# Patient Record
Sex: Female | Born: 1940 | Race: White | Hispanic: No | Marital: Married | Smoking: Never smoker
Health system: Southern US, Community
[De-identification: ages and names within clinical notes are randomized; demographics above are authoritative.]

## PROBLEM LIST (undated history)

## (undated) DIAGNOSIS — E785 Hyperlipidemia, unspecified: Secondary | ICD-10-CM

## (undated) DIAGNOSIS — E119 Type 2 diabetes mellitus without complications: Secondary | ICD-10-CM

## (undated) HISTORY — PX: BREAST REDUCTION SURGERY: SHX8

## (undated) HISTORY — PX: SHOULDER SURGERY: SHX246

---

## 2019-08-15 ENCOUNTER — Encounter (HOSPITAL_COMMUNITY): Payer: Self-pay | Admitting: Emergency Medicine

## 2019-08-15 ENCOUNTER — Other Ambulatory Visit: Payer: Self-pay

## 2019-08-15 ENCOUNTER — Inpatient Hospital Stay (HOSPITAL_COMMUNITY)
Admission: EM | Admit: 2019-08-15 | Discharge: 2019-08-19 | DRG: 392 | Disposition: A | Discharge status: Home / Self Care | Payer: Medicare Other | Attending: Internal Medicine | Admitting: Internal Medicine

## 2019-08-15 DIAGNOSIS — Z20828 Contact with and (suspected) exposure to other viral communicable diseases: Secondary | ICD-10-CM | POA: Diagnosis present

## 2019-08-15 DIAGNOSIS — E119 Type 2 diabetes mellitus without complications: Secondary | ICD-10-CM

## 2019-08-15 DIAGNOSIS — E785 Hyperlipidemia, unspecified: Secondary | ICD-10-CM | POA: Diagnosis present

## 2019-08-15 DIAGNOSIS — E860 Dehydration: Secondary | ICD-10-CM | POA: Diagnosis present

## 2019-08-15 DIAGNOSIS — D649 Anemia, unspecified: Secondary | ICD-10-CM

## 2019-08-15 DIAGNOSIS — K529 Noninfective gastroenteritis and colitis, unspecified: Secondary | ICD-10-CM | POA: Diagnosis not present

## 2019-08-15 DIAGNOSIS — N390 Urinary tract infection, site not specified: Secondary | ICD-10-CM

## 2019-08-15 DIAGNOSIS — R109 Unspecified abdominal pain: Secondary | ICD-10-CM | POA: Diagnosis present

## 2019-08-15 DIAGNOSIS — N10 Acute tubulo-interstitial nephritis: Secondary | ICD-10-CM | POA: Clinically undetermined

## 2019-08-15 DIAGNOSIS — D539 Nutritional anemia, unspecified: Secondary | ICD-10-CM | POA: Diagnosis present

## 2019-08-15 DIAGNOSIS — E1165 Type 2 diabetes mellitus with hyperglycemia: Secondary | ICD-10-CM | POA: Diagnosis present

## 2019-08-15 DIAGNOSIS — E871 Hypo-osmolality and hyponatremia: Secondary | ICD-10-CM | POA: Diagnosis present

## 2019-08-15 DIAGNOSIS — E875 Hyperkalemia: Secondary | ICD-10-CM | POA: Diagnosis present

## 2019-08-15 HISTORY — DX: Hyperlipidemia, unspecified: E78.5

## 2019-08-15 HISTORY — DX: Type 2 diabetes mellitus without complications: E11.9

## 2019-08-15 LAB — COMPREHENSIVE METABOLIC PANEL
ALT: 21 U/L (ref 0–44)
AST: 37 U/L (ref 15–41)
Albumin: 2.3 g/dL — ABNORMAL LOW (ref 3.5–5.0)
Alkaline Phosphatase: 46 U/L (ref 38–126)
Anion gap: 11 (ref 5–15)
BUN: 24 mg/dL — ABNORMAL HIGH (ref 8–23)
CO2: 22 mmol/L (ref 22–32)
Calcium: 8.6 mg/dL — ABNORMAL LOW (ref 8.9–10.3)
Chloride: 96 mmol/L — ABNORMAL LOW (ref 98–111)
Creatinine, Ser: 0.92 mg/dL (ref 0.44–1.00)
GFR calc Af Amer: >60 mL/min (ref >60)
GFR calc non Af Amer: 59 mL/min — ABNORMAL LOW (ref >60)
Glucose, Bld: 211 mg/dL — ABNORMAL HIGH (ref 70–99)
Potassium: 3.5 mmol/L (ref 3.5–5.1)
Sodium: 129 mmol/L — ABNORMAL LOW (ref 135–145)
Total Bilirubin: 3 mg/dL — ABNORMAL HIGH (ref 0.3–1.2)
Total Protein: 10.9 g/dL — ABNORMAL HIGH (ref 6.5–8.1)

## 2019-08-15 LAB — CBC
HCT: 22.8 % — ABNORMAL LOW (ref 36.0–46.0)
Hemoglobin: 8.5 g/dL — ABNORMAL LOW (ref 12.0–15.0)
MCH: 37.8 pg — ABNORMAL HIGH (ref 26.0–34.0)
MCHC: 37.3 g/dL — ABNORMAL HIGH (ref 30.0–36.0)
MCV: 101.3 fL — ABNORMAL HIGH (ref 80.0–100.0)
Platelets: 163 10*3/uL (ref 150–400)
RBC: 2.25 MIL/uL — ABNORMAL LOW (ref 3.87–5.11)
RDW: 17.2 % — ABNORMAL HIGH (ref 11.5–15.5)
WBC: 5.1 10*3/uL (ref 4.0–10.5)
nRBC: 0.8 % — ABNORMAL HIGH (ref 0.0–0.2)

## 2019-08-15 LAB — LIPASE, BLOOD: Lipase: 14 U/L (ref 11–51)

## 2019-08-15 MED ORDER — SODIUM CHLORIDE 0.9% FLUSH
3.0000 mL | Freq: Once | INTRAVENOUS | Status: AC
  Administered 2019-08-16: 05:00:00 3 mL via INTRAVENOUS

## 2019-08-15 NOTE — ED Triage Notes (Signed)
Patient reports generalized abdomina pain with emesis and diarrhea onset last week , denies fever or chills .

## 2019-08-16 ENCOUNTER — Emergency Department (HOSPITAL_COMMUNITY): Payer: Medicare Other

## 2019-08-16 ENCOUNTER — Encounter (HOSPITAL_COMMUNITY): Payer: Self-pay | Admitting: Internal Medicine

## 2019-08-16 DIAGNOSIS — E871 Hypo-osmolality and hyponatremia: Secondary | ICD-10-CM | POA: Diagnosis present

## 2019-08-16 DIAGNOSIS — R109 Unspecified abdominal pain: Secondary | ICD-10-CM | POA: Diagnosis present

## 2019-08-16 DIAGNOSIS — E119 Type 2 diabetes mellitus without complications: Secondary | ICD-10-CM

## 2019-08-16 DIAGNOSIS — N10 Acute tubulo-interstitial nephritis: Secondary | ICD-10-CM | POA: Diagnosis not present

## 2019-08-16 DIAGNOSIS — R1084 Generalized abdominal pain: Secondary | ICD-10-CM | POA: Diagnosis not present

## 2019-08-16 DIAGNOSIS — E785 Hyperlipidemia, unspecified: Secondary | ICD-10-CM | POA: Diagnosis present

## 2019-08-16 DIAGNOSIS — K529 Noninfective gastroenteritis and colitis, unspecified: Secondary | ICD-10-CM | POA: Diagnosis present

## 2019-08-16 DIAGNOSIS — D539 Nutritional anemia, unspecified: Secondary | ICD-10-CM | POA: Diagnosis present

## 2019-08-16 LAB — URINALYSIS, ROUTINE W REFLEX MICROSCOPIC
Bilirubin Urine: NEGATIVE
Glucose, UA: NEGATIVE mg/dL
Ketones, ur: NEGATIVE mg/dL
Nitrite: NEGATIVE
Protein, ur: NEGATIVE mg/dL
Specific Gravity, Urine: 1.031 — ABNORMAL HIGH (ref 1.005–1.030)
pH: 5 (ref 5.0–8.0)

## 2019-08-16 LAB — COMPREHENSIVE METABOLIC PANEL
ALT: 21 U/L (ref 0–44)
AST: 34 U/L (ref 15–41)
Albumin: 2 g/dL — ABNORMAL LOW (ref 3.5–5.0)
Alkaline Phosphatase: 38 U/L (ref 38–126)
Anion gap: 7 (ref 5–15)
BUN: 23 mg/dL (ref 8–23)
CO2: 24 mmol/L (ref 22–32)
Calcium: 7.8 mg/dL — ABNORMAL LOW (ref 8.9–10.3)
Chloride: 100 mmol/L (ref 98–111)
Creatinine, Ser: 0.9 mg/dL (ref 0.44–1.00)
GFR calc Af Amer: >60 mL/min (ref >60)
GFR calc non Af Amer: >60 mL/min (ref >60)
Glucose, Bld: 190 mg/dL — ABNORMAL HIGH (ref 70–99)
Potassium: 3.4 mmol/L — ABNORMAL LOW (ref 3.5–5.1)
Sodium: 131 mmol/L — ABNORMAL LOW (ref 135–145)
Total Bilirubin: 2 mg/dL — ABNORMAL HIGH (ref 0.3–1.2)
Total Protein: 9.7 g/dL — ABNORMAL HIGH (ref 6.5–8.1)

## 2019-08-16 LAB — CBC
HCT: 23.7 % — ABNORMAL LOW (ref 36.0–46.0)
Hemoglobin: 7.6 g/dL — ABNORMAL LOW (ref 12.0–15.0)
MCH: 30.6 pg (ref 26.0–34.0)
MCHC: 32.1 g/dL (ref 30.0–36.0)
MCV: 95.6 fL (ref 80.0–100.0)
Platelets: 156 10*3/uL (ref 150–400)
RBC: 2.48 MIL/uL — ABNORMAL LOW (ref 3.87–5.11)
RDW: 16 % — ABNORMAL HIGH (ref 11.5–15.5)
WBC: 4.8 10*3/uL (ref 4.0–10.5)
nRBC: 0.4 % — ABNORMAL HIGH (ref 0.0–0.2)

## 2019-08-16 LAB — POC OCCULT BLOOD, ED: Fecal Occult Bld: NEGATIVE

## 2019-08-16 LAB — HEMOGLOBIN AND HEMATOCRIT, BLOOD
HCT: 21.3 % — ABNORMAL LOW (ref 36.0–46.0)
Hemoglobin: 7.8 g/dL — ABNORMAL LOW (ref 12.0–15.0)

## 2019-08-16 LAB — SARS CORONAVIRUS 2 (TAT 6-24 HRS): SARS Coronavirus 2: NEGATIVE

## 2019-08-16 LAB — MAGNESIUM: Magnesium: 1.8 mg/dL (ref 1.7–2.4)

## 2019-08-16 LAB — GLUCOSE, CAPILLARY
Glucose-Capillary: 161 mg/dL — ABNORMAL HIGH (ref 70–99)
Glucose-Capillary: 142 mg/dL — ABNORMAL HIGH (ref 70–99)

## 2019-08-16 LAB — VITAMIN B12: Vitamin B-12: 430 pg/mL (ref 180–914)

## 2019-08-16 LAB — ABO/RH: ABO/RH(D): O POS

## 2019-08-16 MED ORDER — ACETAMINOPHEN 325 MG PO TABS
650.0000 mg | ORAL_TABLET | Freq: Four times a day (QID) | ORAL | Status: DC | PRN
  Administered 2019-08-16: 650 mg via ORAL
  Filled 2019-08-16: qty 2

## 2019-08-16 MED ORDER — ALBUTEROL SULFATE (2.5 MG/3ML) 0.083% IN NEBU: 2.5000 mg | INHALATION_SOLUTION | Freq: Four times a day (QID) | RESPIRATORY_TRACT | Status: DC | PRN

## 2019-08-16 MED ORDER — CIPROFLOXACIN IN D5W 400 MG/200ML IV SOLN
400.0000 mg | Freq: Two times a day (BID) | INTRAVENOUS | Status: AC
  Administered 2019-08-16 – 2019-08-18 (×5): 400 mg via INTRAVENOUS
  Filled 2019-08-16 (×5): qty 200

## 2019-08-16 MED ORDER — SODIUM CHLORIDE 0.9 % IV SOLN
INTRAVENOUS | Status: DC
  Administered 2019-08-16 – 2019-08-18 (×2): via INTRAVENOUS

## 2019-08-16 MED ORDER — ONDANSETRON HCL 4 MG PO TABS: 4.0000 mg | ORAL_TABLET | Freq: Four times a day (QID) | ORAL | Status: DC | PRN

## 2019-08-16 MED ORDER — IOHEXOL 300 MG/ML  SOLN
100.0000 mL | Freq: Once | INTRAMUSCULAR | Status: AC | PRN
  Administered 2019-08-16: 100 mL via INTRAVENOUS

## 2019-08-16 MED ORDER — SODIUM CHLORIDE 0.9 % IV BOLUS
500.0000 mL | Freq: Once | INTRAVENOUS | Status: AC
  Administered 2019-08-16: 500 mL via INTRAVENOUS

## 2019-08-16 MED ORDER — POTASSIUM CHLORIDE CRYS ER 20 MEQ PO TBCR
40.0000 meq | EXTENDED_RELEASE_TABLET | ORAL | Status: AC
  Administered 2019-08-16: 17:00:00 40 meq via ORAL
  Filled 2019-08-16: qty 2

## 2019-08-16 MED ORDER — CIPROFLOXACIN HCL 500 MG PO TABS
500.0000 mg | ORAL_TABLET | Freq: Once | ORAL | Status: AC
  Administered 2019-08-16: 08:00:00 500 mg via ORAL
  Filled 2019-08-16: qty 1

## 2019-08-16 MED ORDER — ONDANSETRON HCL 4 MG/2ML IJ SOLN
4.0000 mg | Freq: Four times a day (QID) | INTRAMUSCULAR | Status: DC | PRN
  Administered 2019-08-17: 21:00:00 4 mg via INTRAVENOUS
  Filled 2019-08-16: qty 2

## 2019-08-16 MED ORDER — FENTANYL CITRATE (PF) 100 MCG/2ML IJ SOLN
50.0000 ug | Freq: Once | INTRAMUSCULAR | Status: AC
  Administered 2019-08-16: 05:00:00 50 ug via INTRAVENOUS
  Filled 2019-08-16: qty 2

## 2019-08-16 MED ORDER — CALCIUM GLUCONATE-NACL 2-0.675 GM/100ML-% IV SOLN
2.0000 g | Freq: Once | INTRAVENOUS | Status: AC
  Administered 2019-08-16: 2000 mg via INTRAVENOUS
  Filled 2019-08-16: qty 100

## 2019-08-16 MED ORDER — METRONIDAZOLE IN NACL 5-0.79 MG/ML-% IV SOLN
500.0000 mg | Freq: Three times a day (TID) | INTRAVENOUS | Status: AC
  Administered 2019-08-16 – 2019-08-18 (×8): 500 mg via INTRAVENOUS
  Filled 2019-08-16 (×8): qty 100

## 2019-08-16 MED ORDER — ATORVASTATIN CALCIUM 40 MG PO TABS
40.0000 mg | ORAL_TABLET | Freq: Every day | ORAL | Status: DC
  Administered 2019-08-16 – 2019-08-18 (×3): 40 mg via ORAL
  Filled 2019-08-16 (×3): qty 1

## 2019-08-16 MED ORDER — ACETAMINOPHEN 650 MG RE SUPP: 650.0000 mg | Freq: Four times a day (QID) | RECTAL | Status: DC | PRN

## 2019-08-16 MED ORDER — LIDOCAINE 5 % EX PTCH: 1.0000 | MEDICATED_PATCH | Freq: Every day | CUTANEOUS | Status: DC | PRN

## 2019-08-16 MED ORDER — SODIUM CHLORIDE 0.9 % IV BOLUS
1000.0000 mL | Freq: Once | INTRAVENOUS | Status: AC
  Administered 2019-08-16: 05:00:00 1000 mL via INTRAVENOUS

## 2019-08-16 MED ORDER — INSULIN ASPART 100 UNIT/ML ~~LOC~~ SOLN
0.0000 [IU] | Freq: Three times a day (TID) | SUBCUTANEOUS | Status: DC
  Administered 2019-08-16 – 2019-08-17 (×2): 2 [IU] via SUBCUTANEOUS
  Administered 2019-08-17: 1 [IU] via SUBCUTANEOUS
  Administered 2019-08-18: 12:00:00 2 [IU] via SUBCUTANEOUS
  Administered 2019-08-18 – 2019-08-19 (×3): 1 [IU] via SUBCUTANEOUS

## 2019-08-16 MED ORDER — FENTANYL CITRATE (PF) 100 MCG/2ML IJ SOLN: 25.0000 ug | INTRAMUSCULAR | Status: DC | PRN

## 2019-08-16 MED ORDER — DULOXETINE HCL 60 MG PO CPEP
60.0000 mg | ORAL_CAPSULE | Freq: Every day | ORAL | Status: DC
  Administered 2019-08-16 – 2019-08-19 (×4): 60 mg via ORAL
  Filled 2019-08-16 (×4): qty 1

## 2019-08-16 MED ORDER — ONDANSETRON HCL 4 MG/2ML IJ SOLN
4.0000 mg | Freq: Once | INTRAMUSCULAR | Status: AC
  Administered 2019-08-16: 4 mg via INTRAVENOUS
  Filled 2019-08-16: qty 2

## 2019-08-16 MED ORDER — SODIUM CHLORIDE 0.9% FLUSH
3.0000 mL | Freq: Two times a day (BID) | INTRAVENOUS | Status: DC
  Administered 2019-08-16 – 2019-08-18 (×2): 3 mL via INTRAVENOUS

## 2019-08-16 MED ORDER — VALACYCLOVIR HCL 500 MG PO TABS
500.0000 mg | ORAL_TABLET | Freq: Every morning | ORAL | Status: DC
  Administered 2019-08-17 – 2019-08-19 (×3): 500 mg via ORAL
  Filled 2019-08-16 (×3): qty 1

## 2019-08-16 MED ORDER — POLYVINYL ALCOHOL 1.4 % OP SOLN
1.0000 [drp] | Freq: Every day | OPHTHALMIC | Status: DC | PRN
  Administered 2019-08-18: 05:00:00 1 [drp] via OPHTHALMIC
  Filled 2019-08-16: qty 15

## 2019-08-16 NOTE — ED Notes (Signed)
Pt taken to CT.

## 2019-08-16 NOTE — ED Provider Notes (Signed)
[facility] EMERGENCY DEPARTMENT Provider Note  CSN: [id number] Arrival date & time: 08/15/19 2103  Chief Complaint(s) Abdominal Pain  HPI Traci Ortega is a 78 y.o. female   The history is provided by the patient.  Abdominal Pain Pain location:  Generalized Pain quality: aching and dull   Pain radiates to:  Does not radiate Pain severity:  Moderate Onset quality:  Gradual Duration:  2 weeks Timing:  Intermittent Progression:  Waxing and waning Chronicity:  New Relieved by:  Nothing Worsened by:  Nothing Associated symptoms: anorexia, diarrhea and nausea     Past Medical History History reviewed. No pertinent past medical history. There are no active problems to display for this patient.  Home Medication(s) Prior to Admission medications   Not on File                                                                                                                                    Past Surgical History History reviewed. No pertinent surgical history. Family History No family history on file.  Social History Social History   Tobacco Use   Smoking status: Never Smoker   Smokeless tobacco: Never Used  Substance Use Topics   Alcohol use: Never    Frequency: Never   Drug use: Never   Allergies Patient has no known allergies.  Review of Systems Review of Systems  Gastrointestinal: Positive for abdominal pain, anorexia, diarrhea and nausea.   All other systems are reviewed and are negative for acute change except as noted in the HPI  Physical Exam Vital Signs  I have reviewed the triage vital signs BP (!) 142/75 (BP Location: Left Arm)    Pulse 93    Temp 100 F (37.8 C) (Oral)    Resp 19    SpO2 100%   Physical Exam Vitals signs reviewed.  Constitutional:      General: She is not in acute distress.    Appearance: She is well-developed. She is not diaphoretic.  HENT:     Head: Normocephalic and atraumatic.     Right Ear: External  ear normal.     Left Ear: External ear normal.     Nose: Nose normal.  Eyes:     General: No scleral icterus.    Conjunctiva/sclera: Conjunctivae normal.  Neck:     Musculoskeletal: Normal range of motion.     Trachea: Phonation normal.  Cardiovascular:     Rate and Rhythm: Normal rate and regular rhythm.  Pulmonary:     Effort: Pulmonary effort is normal. No respiratory distress.     Breath sounds: No stridor.  Abdominal:     General: There is no distension.     Tenderness: There is generalized abdominal tenderness (mild discomfort). There is no right CVA tenderness, left CVA tenderness, guarding or rebound.  Musculoskeletal: Normal range of motion.  Neurological:     Mental Status: She is alert and  oriented to person, place, and time.  Psychiatric:        Behavior: Behavior normal.     ED Results and Treatments Labs (all labs ordered are listed, but only abnormal results are displayed) Labs Reviewed  COMPREHENSIVE METABOLIC PANEL - Abnormal; Notable for the following components:      Result Value   Sodium 129 (*)    Chloride 96 (*)    Glucose, Bld 211 (*)    BUN 24 (*)    Calcium 8.6 (*)    Total Protein 10.9 (*)    Albumin 2.3 (*)    Total Bilirubin 3.0 (*)    GFR calc non Af Amer 59 (*)    All other components within normal limits  CBC - Abnormal; Notable for the following components:   RBC 2.25 (*)    Hemoglobin 8.5 (*)    HCT 22.8 (*)    MCV 101.3 (*)    MCH 37.8 (*)    MCHC 37.3 (*)    RDW 17.2 (*)    nRBC 0.8 (*)    All other components within normal limits  LIPASE, BLOOD  URINALYSIS, ROUTINE W REFLEX MICROSCOPIC  POC OCCULT BLOOD, ED                                                                                                                         EKG  EKG Interpretation  Date/Time:    Ventricular Rate:    PR Interval:    QRS Duration:   QT Interval:    QTC Calculation:   R Axis:     Text Interpretation:        Radiology Ct Abdomen  Pelvis W Contrast  Addendum Date: 08/16/2019   ADDENDUM REPORT: 08/16/2019 06:29 ADDENDUM: There should also have been an impression # 5 which reads: 5.  Two cm indeterminate left adrenal nodule. In the absence of prior imaging and with no history of cancer - a 1 year follow-up Adrenal Protocol CT Abdomen without and with contrast is recommended. This recommendation follows ACR consensus guidelines: Management of Incidental Adrenal Masses: A White Paper of the ACR Incidental Findings Committee. J Am Coll Radiol 2017;14:1038-1044. Electronically Signed   By: Odessa Fleming M.D.   On: 08/16/2019 06:29   Result Date: 08/16/2019 CLINICAL DATA:  78 year old female with abdominal pain vomiting and diarrhea. EXAM: CT ABDOMEN AND PELVIS WITH CONTRAST TECHNIQUE: Multidetector CT imaging of the abdomen and pelvis was performed using the standard protocol following bolus administration of intravenous contrast. CONTRAST:  OMNIPAQUE IOHEXOL 300 MG/ML  SOLN COMPARISON:  None. FINDINGS: Lower chest: Calcified granuloma in the lingula, negative visible lung bases. No pericardial or pleural effusion. Possible circumferential soft tissue thickening of the distal esophagus on series 5, image 22. Small gastric hiatal hernia superimposed. Hepatobiliary: Negative liver and gallbladder. No bile duct enlargement. Pancreas: Negative aside from a degree of atrophy. Spleen: Negative. Adrenals/Urinary Tract: Intermediate density 2 centimeter rounded nodule of the left lateral adrenal  limb on series 3, image 25. The right adrenal gland is normal. There is mild left pararenal space asymmetric stranding which might be related to the left colon. However, there is also subtle asymmetric enhancement of the left kidney, especially the lateral left upper pole and left mid pole. This subtle asymmetric enhancement persists on delayed images. However, there is symmetric contrast excretion. No discrete left renal mass. Proximal ureters appear  symmetric and normal. Negative course of the left ureter with superimposed phleboliths. Stomach/Bowel: Negative rectum. Severe diverticulosis of the sigmoid colon, but no active inflammation identified. Diverticula continue into the distal descending colon. The left colon is decompressed, and featureless through to the distal transverse colon. There is questionable mesenteric stranding in and around the splenic flexure (series 3, image 29 and coronal image 48). The mid and proximal transverse colon than have a normal appearance. Moderate right colon diverticulosis with no active inflammation. Decompressed terminal ileum. Normal appendix suspected on series 3, image 53. No dilated small bowel. Negative intraabdominal stomach. Negative duodenum. No free air. No free fluid. No convincing small bowel inflammation. Vascular/Lymphatic: Aortoiliac calcified atherosclerosis. Major arterial structures are patent. Portal venous system is patent. No lymphadenopathy. Reproductive: Negative. Other: No pelvic free fluid. Musculoskeletal: Osteopenia. Lower lumbar facet degeneration. No acute osseous abnormality identified. IMPRESSION: 1. Left pararenal space/left gutter inflammatory stranding with both subtle asymmetric left renal enhancement and a decompressed/featureless appearance of the left large bowel in that region. Differential considerations include Acute Left Pyelonephritis versus Left Acute Colitis. Recommend correlation with urinalysis. No obstructive uropathy, free air or free fluid. 2. Small gastric hiatal hernia with possible superimposed distal esophageal thickening. Consider esophagitis. 3. Superimposed large bowel diverticulosis, maximal in the sigmoid, with no associated active inflammation. 4.  Aortic Atherosclerosis (ICD10-I70.0). Electronically Signed: By: Odessa FlemingH  Hall M.D. On: 08/16/2019 05:47    Pertinent labs & imaging results that were available during my care of the patient were reviewed by me and  considered in my medical decision making (see chart for details).  Medications Ordered in ED Medications  ciprofloxacin (CIPRO) tablet 500 mg (has no administration in time range)  sodium chloride 0.9 % bolus 500 mL (has no administration in time range)  sodium chloride flush (NS) 0.9 % injection 3 mL (3 mLs Intravenous Given 08/16/19 0509)  sodium chloride 0.9 % bolus 1,000 mL (0 mLs Intravenous Stopped 08/16/19 0611)  ondansetron (ZOFRAN) injection 4 mg (4 mg Intravenous Given 08/16/19 0506)  fentaNYL (SUBLIMAZE) injection 50 mcg (50 mcg Intravenous Given 08/16/19 0518)  iohexol (OMNIPAQUE) 300 MG/ML solution 100 mL (100 mLs Intravenous Contrast Given 08/16/19 0524)                                                                                                                                    Procedures Procedures  (including critical care time)  Medical Decision Making / ED Course I have reviewed the nursing notes for this encounter  and the patient's prior records (if available in EHR or on provided paperwork).   Yailine Ballard was evaluated in Emergency Department on 08/16/2019 for the symptoms described in the history of present illness. She was evaluated in the context of the global COVID-19 pandemic, which necessitated consideration that the patient might be at risk for infection with the SARS-CoV-2 virus that causes COVID-19. Institutional protocols and algorithms that pertain to the evaluation of patients at risk for COVID-19 are in a state of rapid change based on information released by regulatory bodies including the CDC and federal and state organizations. These policies and algorithms were followed during the patient's care in the ED.  Here with 2 weeks of generalized fatigue, N/V/D and abd discomfort.  No known sick contacts.  Labs without leukocytosis.  Noted to have macrocytic anemia with a hemoglobin of 8.5.  No prior for comparison.  Hemoccult negative.  Metabolic panel  with mild hyponatremia and hypochloremia.  Patient also has hyperglycemia without evidence of DKA.  No renal insufficiency.  Pending UA.  CT of the abdomen notable for descending colitis versus left pyelonephritis.  Will need to correlate with UA.  Patient provided with IV fluids in interim.  Nausea medicine.  Pain medicine.  Will treat with Cipro to cover either. If UA negative. Add Flagyl to regimen. DC with Abx and Zofran, if able to tolerate oral intake.  Patient care turned over to Dr Alvino Chapel. Patient case and results discussed in detail; please see their note for further ED managment.          Final Clinical Impression(s) / ED Diagnoses Final diagnoses:  None      This chart was dictated using voice recognition software.  Despite best efforts to proofread,  errors can occur which can change the documentation meaning.   Fatima Blank, MD 08/16/19 [phone]

## 2019-08-16 NOTE — Plan of Care (Signed)

## 2019-08-16 NOTE — ED Provider Notes (Signed)
  Physical Exam  BP (!) 150/79   Pulse 92   Temp 100 F (37.8 C) (Oral)   Resp (!) 21   SpO2 98%   Physical Exam  ED Course/Procedures     Procedures  MDM  Patient with abdominal pain weakness.  Decreased oral intake.  Nausea and diarrhea.  Said for last couple weeks.  In town for a wedding.  Has an anemia.  Unknown acute or chronic.  Patient states she has not been told she is anemic before.  CT scan showed colitis versus pyelonephritis.  Urine does show potential infection 2.  Has hyponatremia.  Still feeling somewhat weak but feels better after some fluids.  I feels the patient would benefit from admission to the hospital for further work-up and treatment.  Will discuss with unassigned medicine       Davonna Belling, MD 08/16/19 1035

## 2019-08-16 NOTE — H&P (Signed)
History and Physical    Traci ShengBeverly Safranek ZOX:[id number] DOB: Jun 01, 1941 DOA: 08/15/2019  Referring MD/NP/PA: Benjiman CoreNathan Pickering, MD PCP: Patient, No Pcp Per  Patient coming from: out of town visiting for a wedding  Chief Complaint: Abdominal pain  I have personally briefly [facility]   HPI: Traci Ortega is a 78 y.o. female with medical history significant of hyperlipidemia and diabetes mellitus type 2.  She presents with complaints of abdominal pain, nausea, vomiting, and diarrhea over the last couple weeks.  Patient is a poor historian.  She came into town for a wedding from [place].  Her abdominal pain has moved from right to the left and then she reports that is all over.  Emesis is noted to be nonbloody and nonbilious.  She complains of having diarrhea, but does not know if she has had any blood present.  She has not been able to eat much of anything due to to her symptoms.  ED Course: Upon admission to the emergency department patient was noted to have a temperature of 100 F, pulse 92-113, and O2 saturations maintained on 2 L of nasal cannula oxygen after given pain medication.  Labs from yesterday significant for WBC 5.1, hemoglobin 8.5, sodium 129, potassium 3.5, BUN 24, creatinine 0.92, glucose 211, total protein 10.9, and total bilirubin 3.  Urinalysis was positive for small leukocytes, few bacteria, 6-10 squamous epithelial cells, and 21-50 WBCs.  CT scan abdomen pelvis revealed signs of left pyelonephritis versus left colitis.  Patient having given 1.5 L of normal saline IV fluids, fentanyl, Zofran, and ciprofloxacin 500 mg orally.  TRH called to admit.  Review of Systems  Constitutional: Positive for malaise/fatigue. Negative for fever.  HENT: Negative for congestion and nosebleeds.   Eyes: Negative for photophobia and pain.  Respiratory: Negative for cough and shortness of breath.   Cardiovascular: Negative for chest pain.   Gastrointestinal: Positive for abdominal pain, diarrhea, nausea and vomiting.  Genitourinary: Negative for dysuria.  Musculoskeletal: Negative for joint pain and myalgias.  Neurological: Negative for focal weakness and loss of consciousness.  Psychiatric/Behavioral: Negative for substance abuse. The patient has insomnia.     Past Medical History:  Diagnosis Date  . Diabetes mellitus type 2 in nonobese (HCC)   . HLD (hyperlipidemia)     Past Surgical History:  Procedure Laterality Date  . BREAST REDUCTION SURGERY    . SHOULDER SURGERY       reports that she has never smoked. She has never used smokeless tobacco. She reports that she does not drink alcohol or use drugs.  No Known Allergies  Family History  Problem Relation Age of Onset  . Colon cancer Mother     Prior to Admission medications   Not on File    Physical Exam:  Constitutional: Elderly female in no acute distress at this time. Vitals:   08/16/19 0845 08/16/19 1000 08/16/19 1100 08/16/19 1143  BP: (!) 150/79 126/62 (!) 159/83   Pulse:  75 81   Resp: (!) 21 17 20    Temp:      TempSrc:      SpO2:  94% 95%   Weight:    69.9 kg  Height:    5\' 2"  (1.575 m)   Eyes: PERRL, lids and conjunctivae normal ENMT: Mucous membranes are dry. Posterior pharynx clear of any exudate or lesions.Normal dentition.  Neck: normal, supple, no masses, no thyromegaly Respiratory: clear to auscultation bilaterally, no wheezing, no crackles. Normal respiratory  effort.  Patient currently on 2 L nasal cannula oxygen maintaining O2 saturations Cardiovascular: Regular rate and rhythm, no murmurs / rubs / gallops. No extremity edema. 2+ pedal pulses. No carotid bruits.  Abdomen: Left quadrant tenderness appreciated.  No masses palpated. No hepatosplenomegaly. Bowel sounds positive.  Musculoskeletal: no clubbing / cyanosis. No joint deformity upper and lower extremities. Good ROM, no contractures. Normal muscle tone.  Skin: no rashes,  lesions, ulcers. No induration Neurologic: CN 2-12 grossly intact. Sensation intact, DTR normal. Strength 5/5 in all 4.  Psychiatric: Poor recent memory. Alert and oriented x 3. Normal mood.     Labs on Admission: I have personally reviewed following labs and imaging studies  CBC: Recent Labs  Lab 08/15/19 2129 08/16/19 1230  WBC 5.1 4.8  HGB 8.5* 7.6*  HCT 22.8* 23.7*  MCV 101.3* 95.6  PLT 163 156   Basic Metabolic Panel: Recent Labs  Lab 08/15/19 2129  NA 129*  K 3.5  CL 96*  CO2 22  GLUCOSE 211*  BUN 24*  CREATININE 0.92  CALCIUM 8.6*   GFR: Estimated Creatinine Clearance: 45.4 mL/min (by C-G formula based on SCr of 0.92 mg/dL). Liver Function Tests: Recent Labs  Lab 08/15/19 2129  AST 37  ALT 21  ALKPHOS 46  BILITOT 3.0*  PROT 10.9*  ALBUMIN 2.3*   Recent Labs  Lab 08/15/19 2129  LIPASE 14   No results for input(s): AMMONIA in the last 168 hours. Coagulation Profile: No results for input(s): INR, PROTIME in the last 168 hours. Cardiac Enzymes: No results for input(s): CKTOTAL, CKMB, CKMBINDEX, TROPONINI in the last 168 hours. BNP (last 3 results) No results for input(s): PROBNP in the last 8760 hours. HbA1C: No results for input(s): HGBA1C in the last 72 hours. CBG: No results for input(s): GLUCAP in the last 168 hours. Lipid Profile: No results for input(s): CHOL, HDL, LDLCALC, TRIG, CHOLHDL, LDLDIRECT in the last 72 hours. Thyroid Function Tests: No results for input(s): TSH, T4TOTAL, FREET4, T3FREE, THYROIDAB in the last 72 hours. Anemia Panel: No results for input(s): VITAMINB12, FOLATE, FERRITIN, TIBC, IRON, RETICCTPCT in the last 72 hours. Urine analysis:    Component Value Date/Time   COLORURINE YELLOW 08/16/2019 0919   APPEARANCEUR HAZY (A) 08/16/2019 0919   LABSPEC 1.031 (H) 08/16/2019 0919   PHURINE 5.0 08/16/2019 0919   GLUCOSEU NEGATIVE 08/16/2019 0919   HGBUR MODERATE (A) 08/16/2019 0919   BILIRUBINUR NEGATIVE 08/16/2019  0919   KETONESUR NEGATIVE 08/16/2019 0919   PROTEINUR NEGATIVE 08/16/2019 0919   NITRITE NEGATIVE 08/16/2019 0919   LEUKOCYTESUR SMALL (A) 08/16/2019 0919   Sepsis Labs: No results found for this or any previous visit (from the past 240 hour(s)).   Radiological Exams on Admission: Ct Abdomen Pelvis W Contrast  Addendum Date: 08/16/2019   ADDENDUM REPORT: 08/16/2019 06:29 ADDENDUM: There should also have been an impression # 5 which reads: 5.  Two cm indeterminate left adrenal nodule. In the absence of prior imaging and with no history of cancer - a 1 year follow-up Adrenal Protocol CT Abdomen without and with contrast is recommended. This recommendation follows ACR consensus guidelines: Management of Incidental Adrenal Masses: A White Paper of the ACR Incidental Findings Committee. J Am Coll Radiol 2017;14:1038-1044. Electronically Signed   By: Odessa Fleming M.D.   On: 08/16/2019 06:29   Result Date: 08/16/2019 CLINICAL DATA:  78 year old female with abdominal pain vomiting and diarrhea. EXAM: CT ABDOMEN AND PELVIS WITH CONTRAST TECHNIQUE: Multidetector CT imaging of the abdomen  and pelvis was performed using the standard protocol following bolus administration of intravenous contrast. CONTRAST:  OMNIPAQUE IOHEXOL 300 MG/ML  SOLN COMPARISON:  None. FINDINGS: Lower chest: Calcified granuloma in the lingula, negative visible lung bases. No pericardial or pleural effusion. Possible circumferential soft tissue thickening of the distal esophagus on series 5, image 22. Small gastric hiatal hernia superimposed. Hepatobiliary: Negative liver and gallbladder. No bile duct enlargement. Pancreas: Negative aside from a degree of atrophy. Spleen: Negative. Adrenals/Urinary Tract: Intermediate density 2 centimeter rounded nodule of the left lateral adrenal limb on series 3, image 25. The right adrenal gland is normal. There is mild left pararenal space asymmetric stranding which might be related to the left colon.  However, there is also subtle asymmetric enhancement of the left kidney, especially the lateral left upper pole and left mid pole. This subtle asymmetric enhancement persists on delayed images. However, there is symmetric contrast excretion. No discrete left renal mass. Proximal ureters appear symmetric and normal. Negative course of the left ureter with superimposed phleboliths. Stomach/Bowel: Negative rectum. Severe diverticulosis of the sigmoid colon, but no active inflammation identified. Diverticula continue into the distal descending colon. The left colon is decompressed, and featureless through to the distal transverse colon. There is questionable mesenteric stranding in and around the splenic flexure (series 3, image 29 and coronal image 48). The mid and proximal transverse colon than have a normal appearance. Moderate right colon diverticulosis with no active inflammation. Decompressed terminal ileum. Normal appendix suspected on series 3, image 53. No dilated small bowel. Negative intraabdominal stomach. Negative duodenum. No free air. No free fluid. No convincing small bowel inflammation. Vascular/Lymphatic: Aortoiliac calcified atherosclerosis. Major arterial structures are patent. Portal venous system is patent. No lymphadenopathy. Reproductive: Negative. Other: No pelvic free fluid. Musculoskeletal: Osteopenia. Lower lumbar facet degeneration. No acute osseous abnormality identified. IMPRESSION: 1. Left pararenal space/left gutter inflammatory stranding with both subtle asymmetric left renal enhancement and a decompressed/featureless appearance of the left large bowel in that region. Differential considerations include Acute Left Pyelonephritis versus Left Acute Colitis. Recommend correlation with urinalysis. No obstructive uropathy, free air or free fluid. 2. Small gastric hiatal hernia with possible superimposed distal esophageal thickening. Consider esophagitis. 3. Superimposed large bowel  diverticulosis, maximal in the sigmoid, with no associated active inflammation. 4.  Aortic Atherosclerosis (ICD10-I70.0). Electronically Signed: By: Odessa Fleming M.D. On: 08/16/2019 05:47    EKG: Independently reviewed.  Sinus rhythm at 92 bpm  Assessment/Plan Abdominal pain 2/2 suspect pyelonephritis versus colitis: Acute.  Patient presents with complaints of abdominal pain with nausea, vomiting, and reports of diarrhea.  Urinalysis was noted to be abnormal.  CT imaging concerning for left pyelonephritis versus colitis.  Patient has been started on antibiotics of ciprofloxacin p.o. 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  Patient presents with hemoglobin 8.5. MCV and MCH elevated. No previous baseline hemoglobin to compare.  She currently receives vitamin B12 injections.  Stool guaiacs were noted to be negative.   -Type and screen for possible need of blood products -Check vitamin B12 and folate level -Recheck CBC -Monitor and transfuse blood products as needed  Hyponatremia: Acute.  Initial sodium 129.  Patient reports having nausea vomiting. -Repeat BMP  Hyperbilirubinemia: Acute.  Total bilirubin initially elevated up to  3.  -Recheck CMP  Diabetes mellitus type 2: On admission patient presents with blood glucose 211.  She reports history of type 2 diabetes mellitus and currently on Metformin..  -Hypoglycemic protocol  -Hold Metformin -CBGs before every meal with sensitive SSI -Adjust insulin regimen as needed  Hyperlipidemia -Continue atorvastatin  DVT prophylaxis: SCDs Code Status: Full Family Communication: Discussed plan of care with the patient and her husband who is present at bedside Disposition  Plan: Likely discharge in 1 to 2 days Consults called: none  Admission status: observation  Norval Morton MD [facility] Pager [phone]   If 7PM-7AM, please contact night-coverage [email or web address] Password [facility]  08/16/2019, 12:58 PM

## 2019-08-16 NOTE — ED Notes (Signed)
Pt placed on purewick 

## 2019-08-16 NOTE — ED Notes (Signed)
RN put pt on 2L of o2 when she is going to sleep. Her o2 drops to 80s.

## 2019-08-16 NOTE — ED Notes (Signed)
Pt given soda per MD order.

## 2019-08-16 NOTE — ED Notes (Signed)
Pt states she is going to try to pee in the [place].

## 2019-08-17 ENCOUNTER — Encounter (HOSPITAL_COMMUNITY): Payer: Self-pay | Admitting: *Deleted

## 2019-08-17 DIAGNOSIS — K529 Noninfective gastroenteritis and colitis, unspecified: Principal | ICD-10-CM

## 2019-08-17 DIAGNOSIS — E871 Hypo-osmolality and hyponatremia: Secondary | ICD-10-CM

## 2019-08-17 DIAGNOSIS — Z20828 Contact with and (suspected) exposure to other viral communicable diseases: Secondary | ICD-10-CM | POA: Diagnosis present

## 2019-08-17 DIAGNOSIS — N10 Acute tubulo-interstitial nephritis: Secondary | ICD-10-CM | POA: Diagnosis present

## 2019-08-17 DIAGNOSIS — E785 Hyperlipidemia, unspecified: Secondary | ICD-10-CM | POA: Diagnosis present

## 2019-08-17 DIAGNOSIS — E119 Type 2 diabetes mellitus without complications: Secondary | ICD-10-CM

## 2019-08-17 DIAGNOSIS — E1165 Type 2 diabetes mellitus with hyperglycemia: Secondary | ICD-10-CM | POA: Diagnosis present

## 2019-08-17 DIAGNOSIS — E875 Hyperkalemia: Secondary | ICD-10-CM | POA: Diagnosis present

## 2019-08-17 DIAGNOSIS — R1084 Generalized abdominal pain: Secondary | ICD-10-CM | POA: Diagnosis not present

## 2019-08-17 DIAGNOSIS — D539 Nutritional anemia, unspecified: Secondary | ICD-10-CM | POA: Diagnosis present

## 2019-08-17 DIAGNOSIS — E860 Dehydration: Secondary | ICD-10-CM | POA: Diagnosis present

## 2019-08-17 DIAGNOSIS — D649 Anemia, unspecified: Secondary | ICD-10-CM | POA: Diagnosis not present

## 2019-08-17 LAB — CBC
HCT: 23.6 % — ABNORMAL LOW (ref 36.0–46.0)
Hemoglobin: 8 g/dL — ABNORMAL LOW (ref 12.0–15.0)
MCH: 33.2 pg (ref 26.0–34.0)
MCHC: 33.9 g/dL (ref 30.0–36.0)
MCV: 97.9 fL (ref 80.0–100.0)
Platelets: 157 10*3/uL (ref 150–400)
RBC: 2.41 MIL/uL — ABNORMAL LOW (ref 3.87–5.11)
RDW: 16.5 % — ABNORMAL HIGH (ref 11.5–15.5)
WBC: 6.3 10*3/uL (ref 4.0–10.5)
nRBC: 0 % (ref 0.0–0.2)

## 2019-08-17 LAB — BASIC METABOLIC PANEL
Anion gap: 11 (ref 5–15)
BUN: 22 mg/dL (ref 8–23)
CO2: 21 mmol/L — ABNORMAL LOW (ref 22–32)
Calcium: 8.7 mg/dL — ABNORMAL LOW (ref 8.9–10.3)
Chloride: 101 mmol/L (ref 98–111)
Creatinine, Ser: 0.8 mg/dL (ref 0.44–1.00)
GFR calc Af Amer: >60 mL/min (ref >60)
GFR calc non Af Amer: >60 mL/min (ref >60)
Glucose, Bld: 157 mg/dL — ABNORMAL HIGH (ref 70–99)
Potassium: 5.7 mmol/L — ABNORMAL HIGH (ref 3.5–5.1)
Sodium: 133 mmol/L — ABNORMAL LOW (ref 135–145)

## 2019-08-17 LAB — HEMOGLOBIN A1C
Hgb A1c MFr Bld: 7.7 % — ABNORMAL HIGH (ref 4.8–5.6)
Mean Plasma Glucose: 174.29 mg/dL

## 2019-08-17 LAB — GLUCOSE, CAPILLARY
Glucose-Capillary: 149 mg/dL — ABNORMAL HIGH (ref 70–99)
Glucose-Capillary: 193 mg/dL — ABNORMAL HIGH (ref 70–99)
Glucose-Capillary: 71 mg/dL (ref 70–99)
Glucose-Capillary: 133 mg/dL — ABNORMAL HIGH (ref 70–99)

## 2019-08-17 LAB — URINE CULTURE

## 2019-08-17 NOTE — TOC Initial Note (Signed)
Transition of Care [facility]) - Initial/Assessment Note    Patient Details  Name: Yvonnie Schinke MRN: [id number] Date of Birth: Feb 17, 1941  Transition of Care [facility]) CM/SW Contact:    Kingsley Plan, RN Phone Number: 08/17/2019, 11:53 AM  Clinical Narrative:                 Confirmed face sheet information with patient at bedside. Patient drove herself from [place] to [place] on November 13, for her grandson's wedding which is today at 5:30 pm. Patient from home independent no DME, currently on room air. Patient is staying with her daughter while in town. Her husband and another daughter came to [facility] August 15, 2019.   Patient has PCP in [place]   Expected Discharge Plan: Home/Self Care Barriers to Discharge: Continued Medical Work up   Patient Goals and CMS Choice Patient states their goals for this hospitalization and ongoing recovery are:: to return to home CMS Medicare.gov Compare Post Acute Care list provided to:: Patient Choice offered to / list presented to : NA  Expected Discharge Plan and Services Expected Discharge Plan: Home/Self Care     Post Acute Care Choice: NA Living arrangements for the past 2 months: Single Family Home                 DME Arranged: N/A         HH Arranged: NA          Prior Living Arrangements/Services Living arrangements for the past 2 months: Single Family Home Lives with:: Spouse Patient language and need for interpreter reviewed:: Yes Do you feel safe going back to the place where you live?: Yes      Need for Family Participation in Patient Care: Yes (Comment) Care giver support system in place?: Yes (comment)   Criminal Activity/Legal Involvement Pertinent to Current Situation/Hospitalization: No - Comment as needed  Activities of Daily Living Home Assistive Devices/Equipment: Eyeglasses ADL Screening (condition at time of admission) Patient's cognitive ability adequate to safely complete daily activities?:  Yes Is the patient deaf or have difficulty hearing?: Yes Does the patient have difficulty seeing, even when wearing glasses/contacts?: No Does the patient have difficulty concentrating, remembering, or making decisions?: No Patient able to express need for assistance with ADLs?: Yes Does the patient have difficulty dressing or bathing?: No Independently performs ADLs?: Yes (appropriate for developmental age) Does the patient have difficulty walking or climbing stairs?: Yes Weakness of Legs: None Weakness of Arms/Hands: None  Permission Sought/Granted   Permission granted to share information with : No              Emotional Assessment Appearance:: Appears stated age Attitude/Demeanor/Rapport: Engaged Affect (typically observed): Accepting Orientation: : Oriented to Self, Oriented to Place, Oriented to  Time, Oriented to Situation Alcohol / Substance Use: Not Applicable Psych Involvement: No (comment)  Admission diagnosis:  Colitis [K52.9] Urinary tract infection with hematuria, site unspecified [N39.0, R31.9] Anemia, unspecified type [D64.9] Patient Active Problem List   Diagnosis Date Noted  . Colitis 08/16/2019  . Acute pyelonephritis 08/16/2019  . Macrocytic anemia 08/16/2019  . Hyponatremia 08/16/2019  . Type 2 diabetes mellitus without complication (HCC) 08/16/2019  . Hyperbilirubinemia 08/16/2019  . Hyperlipidemia 08/16/2019  . Abdominal pain 08/16/2019   PCP:  Patient, No Pcp Per Pharmacy:   [id number] - [place], [place] - [address] AT [facility] [address] [facility] [place] [address] Phone: [phone] Fax: [phone]  [facility]  [id number] [place], [place] [address]  [place] [address] Phone: [phone] Fax: [phone]     Social Determinants of Health (SDOH) Interventions    Readmission Risk Interventions No flowsheet data  found.

## 2019-08-17 NOTE — Progress Notes (Signed)
Progress Note    Traci Ortega  EXB:[id number] DOB: 1940/09/27  DOA: 08/15/2019 PCP: Patient, No Pcp Per    Brief Narrative:    Medical records reviewed and are as summarized below:  Traci Ortega is an 78 y.o. female with medical history significant of hyperlipidemia and diabetes mellitus type 2.  She presents with complaints of abdominal pain, nausea, vomiting, and diarrhea over the last couple weeks.  Patient is a poor historian.  She came into town for a wedding from [place].  Her abdominal pain has moved from right to the left and then she reports that is all over.  Emesis is noted to be nonbloody and nonbilious.  She complains of having diarrhea, but does not know if she has had any blood present.  She has not been able to eat much of anything due to to her symptoms.  Assessment/Plan:   Principal Problem:   Abdominal pain Active Problems:   Colitis   Acute pyelonephritis   Macrocytic anemia   Hyponatremia   Type 2 diabetes mellitus without complication (HCC)   Hyperbilirubinemia   Hyperlipidemia   Abdominal pain 2/2 colitis: Acute.   -Patient presents with complaints of abdominal pain with nausea, vomiting, and reports of diarrhea.  - CT imaging concerning for left colitis.   -IV cipro/flagyl -Antiemetics as needed -Normal saline IV fluids at 75 mL/h -full diet-- advance as tolerated  Macrocytic anemia: Acute on chronic.  Patient presents with hemoglobin 8.5. MCV and MCH elevated. No previous baseline hemoglobin to compare.  She currently receives vitamin B12 injections.  Stool guaiacs were noted to be negative.   -outpatient follow up when she returns to [place]  Hyponatremia: Acute.  Initial sodium 129.   -improving with IVF  Hyperbilirubinemia: Acute.  Total bilirubin initially elevated up to 3.  -trending down  Diabetes mellitus type 2: On admission patient presents with blood glucose 211.  She reports history of type 2 diabetes mellitus and currently on  Metformin..  -Hold Metformin -CBGs before every meal with sensitive SSI  Hyperkalemia -suspect hemolysis  Hyperlipidemia -Continue atorvastatin  COVID 19 negative  Family Communication/Anticipated D/C date and plan/Code Status   DVT prophylaxis: scd Code Status: Full Code.  Family Communication:  Disposition Plan: home once able to tolerate PO   Medical Consultants:    None.    Subjective:   C/o pain after eating breakfast  Objective:    Vitals:   08/16/19 1430 08/16/19 1500 08/16/19 1543 08/17/19 0550  BP: 122/63 124/63 (!) 127/54 105/69  Pulse: 77 76 91 81  Resp: 19 17 16 16   Temp:   98.4 F (36.9 C) 97.8 F (36.6 C)  TempSrc:   Oral Oral  SpO2: 99% 100% 100% 98%  Weight:      Height:        Intake/Output Summary (Last 24 hours) at 08/17/2019 1130 Last data filed at 08/16/2019 1718 Gross per 24 hour  Intake 102.58 ml  Output -  Net 102.58 ml   Filed Weights   08/16/19 1143  Weight: 69.9 kg    Exam: Difficult historian rrr +BS, tender in LUQ Moves all 4 ext Alert and oriented  Data Reviewed:   I have personally reviewed following labs and imaging studies:  Labs: Labs show the following:   Basic Metabolic Panel: Recent Labs  Lab 08/15/19 2129 08/16/19 1230 08/17/19 0409  NA 129* 131* 133*  K 3.5 3.4* 5.7*  CL 96* 100 101  CO2 22 24 21*  GLUCOSE 211* 190* 157*  BUN 24* 23 22  CREATININE 0.92 0.90 0.80  CALCIUM 8.6* 7.8* 8.7*  MG  --  1.8  --    GFR Estimated Creatinine Clearance: 52.2 mL/min (by C-G formula based on SCr of 0.8 mg/dL). Liver Function Tests: Recent Labs  Lab 08/15/19 2129 08/16/19 1230  AST 37 34  ALT 21 21  ALKPHOS 46 38  BILITOT 3.0* 2.0*  PROT 10.9* 9.7*  ALBUMIN 2.3* 2.0*   Recent Labs  Lab 08/15/19 2129  LIPASE 14   No results for input(s): AMMONIA in the last 168 hours. Coagulation profile No results for input(s): INR, PROTIME in the last 168 hours.  CBC: Recent Labs  Lab 08/15/19  2129 08/16/19 1230 08/16/19 1741 08/17/19 0409  WBC 5.1 4.8  --  6.3  HGB 8.5* 7.6* 7.8* 8.0*  HCT 22.8* 23.7* 21.3* 23.6*  MCV 101.3* 95.6  --  97.9  PLT 163 156  --  157   Cardiac Enzymes: No results for input(s): CKTOTAL, CKMB, CKMBINDEX, TROPONINI in the last 168 hours. BNP (last 3 results) No results for input(s): PROBNP in the last 8760 hours. CBG: Recent Labs  Lab 08/16/19 1556 08/16/19 2155 08/17/19 0553  GLUCAP 161* 142* 149*   D-Dimer: No results for input(s): DDIMER in the last 72 hours. Hgb A1c: Recent Labs    08/17/19 0409  HGBA1C 7.7*   Lipid Profile: No results for input(s): CHOL, HDL, LDLCALC, TRIG, CHOLHDL, LDLDIRECT in the last 72 hours. Thyroid function studies: No results for input(s): TSH, T4TOTAL, T3FREE, THYROIDAB in the last 72 hours.  Invalid input(s): FREET3 Anemia work up: Recent Labs    08/16/19 1230  VITAMINB12 430   Sepsis Labs: Recent Labs  Lab 08/15/19 2129 08/16/19 1230 08/17/19 0409  WBC 5.1 4.8 6.3    Microbiology Recent Results (from the past 240 hour(s))  Urine culture     Status: Abnormal   Collection Time: 08/16/19  9:19 AM   Specimen: Urine, Random  Result Value Ref Range Status   Specimen Description URINE, RANDOM  Final   Special Requests   Final    NONE Performed at [facility], [address], [place], [place] [address]    Culture MULTIPLE SPECIES PRESENT, SUGGEST RECOLLECTION (A)  Final   Report Status 08/17/2019 FINAL  Final  SARS CORONAVIRUS 2 (TAT 6-24 HRS) Nasopharyngeal Nasopharyngeal Swab     Status: None   Collection Time: 08/16/19 10:46 AM   Specimen: Nasopharyngeal Swab  Result Value Ref Range Status   SARS Coronavirus 2 NEGATIVE NEGATIVE Final    Comment: (NOTE) SARS-CoV-2 target nucleic acids are NOT DETECTED. The SARS-CoV-2 RNA is generally detectable in upper and lower respiratory specimens during the acute phase of infection. Negative results do not preclude SARS-CoV-2  infection, do not rule out co-infections with other pathogens, and should not be used as the sole basis for treatment or other patient management decisions. Negative results must be combined with clinical observations, patient history, and epidemiological information. The expected result is Negative. Fact Sheet for Patients: HairSlick.no Fact Sheet for Healthcare Providers: quierodirigir.com This test is not yet approved or cleared by the [place] FDA and  has been authorized for detection and/or diagnosis of SARS-CoV-2 by FDA under an Emergency Use Authorization (EUA). This EUA will remain  in effect (meaning this test can be used) for the duration of the COVID-19 declaration under Section 56 4(b)(1) of the Act, 21 U.S.C. section 360bbb-3(b)(1), unless the authorization  is terminated or revoked sooner. Performed at [facility], [address] [place], [place] [address]     Procedures and diagnostic studies:  Ct Abdomen Pelvis W Contrast  Addendum Date: 08/16/2019   ADDENDUM REPORT: 08/16/2019 06:29 ADDENDUM: There should also have been an impression # 5 which reads: 5.  Two cm indeterminate left adrenal nodule. In the absence of prior imaging and with no history of cancer - a 1 year follow-up Adrenal Protocol CT Abdomen without and with contrast is recommended. This recommendation follows ACR consensus guidelines: Management of Incidental Adrenal Masses: A White Paper of the ACR Incidental Findings Committee. J Am Coll Radiol 2017;14:1038-1044. Electronically Signed   By: Odessa FlemingH  Hall M.D.   On: 08/16/2019 06:29   Result Date: 08/16/2019 CLINICAL DATA:  78 year old female with abdominal pain vomiting and diarrhea. EXAM: CT ABDOMEN AND PELVIS WITH CONTRAST TECHNIQUE: Multidetector CT imaging of the abdomen and pelvis was performed using the standard protocol following bolus administration of intravenous contrast. CONTRAST:   100mL OMNIPAQUE IOHEXOL 300 MG/ML  SOLN COMPARISON:  None. FINDINGS: Lower chest: Calcified granuloma in the lingula, negative visible lung bases. No pericardial or pleural effusion. Possible circumferential soft tissue thickening of the distal esophagus on series 5, image 22. Small gastric hiatal hernia superimposed. Hepatobiliary: Negative liver and gallbladder. No bile duct enlargement. Pancreas: Negative aside from a degree of atrophy. Spleen: Negative. Adrenals/Urinary Tract: Intermediate density 2 centimeter rounded nodule of the left lateral adrenal limb on series 3, image 25. The right adrenal gland is normal. There is mild left pararenal space asymmetric stranding which might be related to the left colon. However, there is also subtle asymmetric enhancement of the left kidney, especially the lateral left upper pole and left mid pole. This subtle asymmetric enhancement persists on delayed images. However, there is symmetric contrast excretion. No discrete left renal mass. Proximal ureters appear symmetric and normal. Negative course of the left ureter with superimposed phleboliths. Stomach/Bowel: Negative rectum. Severe diverticulosis of the sigmoid colon, but no active inflammation identified. Diverticula continue into the distal descending colon. The left colon is decompressed, and featureless through to the distal transverse colon. There is questionable mesenteric stranding in and around the splenic flexure (series 3, image 29 and coronal image 48). The mid and proximal transverse colon than have a normal appearance. Moderate right colon diverticulosis with no active inflammation. Decompressed terminal ileum. Normal appendix suspected on series 3, image 53. No dilated small bowel. Negative intraabdominal stomach. Negative duodenum. No free air. No free fluid. No convincing small bowel inflammation. Vascular/Lymphatic: Aortoiliac calcified atherosclerosis. Major arterial structures are patent. Portal  venous system is patent. No lymphadenopathy. Reproductive: Negative. Other: No pelvic free fluid. Musculoskeletal: Osteopenia. Lower lumbar facet degeneration. No acute osseous abnormality identified. IMPRESSION: 1. Left pararenal space/left gutter inflammatory stranding with both subtle asymmetric left renal enhancement and a decompressed/featureless appearance of the left large bowel in that region. Differential considerations include Acute Left Pyelonephritis versus Left Acute Colitis. Recommend correlation with urinalysis. No obstructive uropathy, free air or free fluid. 2. Small gastric hiatal hernia with possible superimposed distal esophageal thickening. Consider esophagitis. 3. Superimposed large bowel diverticulosis, maximal in the sigmoid, with no associated active inflammation. 4.  Aortic Atherosclerosis (ICD10-I70.0). Electronically Signed: By: Odessa FlemingH  Hall M.D. On: 08/16/2019 05:47    Medications:   . atorvastatin  40 mg Oral QHS  . DULoxetine  60 mg Oral Daily  . insulin aspart  0-9 Units Subcutaneous TID WC  . sodium chloride  flush  3 mL Intravenous Q12H  . valACYclovir  500 mg Oral q morning - 10a   Continuous Infusions: . sodium chloride 75 mL/hr at 08/16/19 1344  . ciprofloxacin 400 mg (08/17/19 1037)  . metronidazole 500 mg (08/17/19 0407)     LOS: 0 days   Joseph Art  [facility]   How to contact the [facility] Attending or Consulting provider 7A - 7P or covering provider during after hours 7P -7A, for this patient?  1. Check the care team in [facility] and look for a) attending/consulting TR[facility] provider listed and b) the [facility] team listed 2. Log into [email or web address] and use [facility] universal password to access. If you do not have the password, please contact the hospital operator. 3. Locate the [facility] provider you are looking for under [facility] and page to a number that you can be directly reached. 4. If you still have difficulty reaching the provider, please page the  [facility] (Director on Call) for the Hospitalists listed on amion for assistance.  08/17/2019, 11:30 AM

## 2019-08-18 DIAGNOSIS — D649 Anemia, unspecified: Secondary | ICD-10-CM

## 2019-08-18 LAB — HEMOGLOBIN AND HEMATOCRIT, BLOOD
HCT: 24.1 % — ABNORMAL LOW (ref 36.0–46.0)
Hemoglobin: 8.3 g/dL — ABNORMAL LOW (ref 12.0–15.0)

## 2019-08-18 LAB — CBC
HCT: 21.1 % — ABNORMAL LOW (ref 36.0–46.0)
Hemoglobin: 7.1 g/dL — ABNORMAL LOW (ref 12.0–15.0)
MCH: 32.7 pg (ref 26.0–34.0)
MCHC: 33.6 g/dL (ref 30.0–36.0)
MCV: 97.2 fL (ref 80.0–100.0)
Platelets: 177 10*3/uL (ref 150–400)
RBC: 2.17 MIL/uL — ABNORMAL LOW (ref 3.87–5.11)
RDW: 16.3 % — ABNORMAL HIGH (ref 11.5–15.5)
WBC: 5.6 10*3/uL (ref 4.0–10.5)
nRBC: 0 % (ref 0.0–0.2)

## 2019-08-18 LAB — FERRITIN: Ferritin: 360 ng/mL — ABNORMAL HIGH (ref 11–307)

## 2019-08-18 LAB — COMPREHENSIVE METABOLIC PANEL
ALT: 31 U/L (ref 0–44)
AST: 56 U/L — ABNORMAL HIGH (ref 15–41)
Albumin: 2 g/dL — ABNORMAL LOW (ref 3.5–5.0)
Alkaline Phosphatase: 39 U/L (ref 38–126)
Anion gap: 9 (ref 5–15)
BUN: 14 mg/dL (ref 8–23)
CO2: 22 mmol/L (ref 22–32)
Calcium: 8.1 mg/dL — ABNORMAL LOW (ref 8.9–10.3)
Chloride: 105 mmol/L (ref 98–111)
Creatinine, Ser: 0.85 mg/dL (ref 0.44–1.00)
GFR calc Af Amer: >60 mL/min (ref >60)
GFR calc non Af Amer: >60 mL/min (ref >60)
Glucose, Bld: 139 mg/dL — ABNORMAL HIGH (ref 70–99)
Potassium: 4.7 mmol/L (ref 3.5–5.1)
Sodium: 136 mmol/L (ref 135–145)
Total Bilirubin: 0.9 mg/dL (ref 0.3–1.2)
Total Protein: 8.5 g/dL — ABNORMAL HIGH (ref 6.5–8.1)

## 2019-08-18 LAB — GLUCOSE, CAPILLARY
Glucose-Capillary: 134 mg/dL — ABNORMAL HIGH (ref 70–99)
Glucose-Capillary: 188 mg/dL — ABNORMAL HIGH (ref 70–99)
Glucose-Capillary: 131 mg/dL — ABNORMAL HIGH (ref 70–99)
Glucose-Capillary: 201 mg/dL — ABNORMAL HIGH (ref 70–99)

## 2019-08-18 LAB — FOLATE RBC
Folate, Hemolysate: 242 ng/mL
Folate, RBC: 1043 ng/mL (ref >498)
Hematocrit: 23.2 % — ABNORMAL LOW (ref 34.0–46.6)

## 2019-08-18 LAB — IRON AND TIBC
Iron: 45 ug/dL (ref 28–170)
Saturation Ratios: 25 % (ref 10.4–31.8)
TIBC: 181 ug/dL — ABNORMAL LOW (ref 250–450)
UIBC: 136 ug/dL

## 2019-08-18 LAB — HEMATOLOGY COMMENTS:

## 2019-08-18 LAB — PREPARE RBC (CROSSMATCH)

## 2019-08-18 MED ORDER — SODIUM CHLORIDE 0.9% IV SOLUTION
Freq: Once | INTRAVENOUS | Status: AC
  Administered 2019-08-18: 10:00:00 via INTRAVENOUS

## 2019-08-18 MED ORDER — AMOXICILLIN-POT CLAVULANATE 875-125 MG PO TABS
1.0000 | ORAL_TABLET | Freq: Two times a day (BID) | ORAL | Status: DC
  Administered 2019-08-19: 1 via ORAL
  Filled 2019-08-18: qty 1

## 2019-08-18 MED ORDER — SODIUM CHLORIDE 0.9 % IV SOLN
INTRAVENOUS | Status: DC | PRN
  Administered 2019-08-18: 250 mL via INTRAVENOUS

## 2019-08-18 NOTE — Progress Notes (Signed)
Progress Note    Traci Ortega  WRU:[id number] DOB: [birth year]  DOA: 08/15/2019 PCP: Patient, No Pcp Per    Brief Narrative:    Medical records reviewed and are as summarized below:  Traci Ortega is an 78 y.o. female with medical history significant of hyperlipidemia and diabetes mellitus type 2.  She presents with complaints of abdominal pain, nausea, vomiting, and diarrhea over the last couple weeks.  Patient is a poor historian.  She came into town for a wedding from [place].  Her abdominal pain has moved from right to the left and then she reports that is all over.  Emesis is noted to be nonbloody and nonbilious.  She complains of having diarrhea, but does not know if she has had any blood present.  She has not been able to eat much of anything due to to her symptoms.  Assessment/Plan:   Principal Problem:   Abdominal pain Active Problems:   Colitis   Acute pyelonephritis   Macrocytic anemia   Hyponatremia   Type 2 diabetes mellitus without complication (HCC)   Hyperbilirubinemia   Hyperlipidemia   Abdominal pain 2/2 colitis: Acute.   -Patient presents with complaints of abdominal pain with nausea, vomiting, and reports of diarrhea.  - CT imaging concerning for left flexure colitis.   -? Infectious as improving on IV cipro/flagyl -no history of a fib-- doubt ischemic -Antiemetics as needed -Normal saline IV fluids at 75 mL/h -full diet-- advance as tolerated  Macrocytic anemia: Acute on chronic.  Patient presents with hemoglobin 8.5 down to 7.1 w/o any sign of bleeding - She currently receives vitamin B12 injections.   -Stool guaiacs were noted to be negative.   -will give 1 unit PRBC and recheck -outpatient follow up when she returns to [place]  Hyponatremia: Acute.  Initial sodium 129.   -improving with IVF  Hyperbilirubinemia:  -trending down, probably due to dehydration  Diabetes mellitus type 2: On admission patient presents with blood glucose 211.   She reports history of type 2 diabetes mellitus and currently on Metformin..  -Hold Metformin -CBGs before every meal with sensitive SSI  Hyperkalemia -resolved  Hyperlipidemia -Continue atorvastatin  COVID 19 negative  Family Communication/Anticipated D/C date and plan/Code Status   DVT prophylaxis: scd Code Status: Full Code.  Family Communication:  Disposition Plan: home once able to tolerate PO abx-- she will need close outpatient follow up there   Medical Consultants:    None.    Subjective:   Tolerating liquid diet  Objective:    Vitals:   08/18/19 0010 08/18/19 0603 08/18/19 0956 08/18/19 1019  BP: 103/65 (!) 111/58 (!) 119/59 121/64  Pulse: 78 79 77 80  Resp: 15 16 16 16   Temp: 97.9 F (36.6 C) 98.5 F (36.9 C) 98 F (36.7 C) 98 F (36.7 C)  TempSrc: Oral Oral Oral Oral  SpO2: 97% 96% 96% 96%  Weight:      Height:        Intake/Output Summary (Last 24 hours) at 08/18/2019 1143 Last data filed at 08/18/2019 0500 Gross per 24 hour  Intake 1395.1 ml  Output -  Net 1395.1 ml   Filed Weights   08/16/19 1143  Weight: 69.9 kg    Exam: In bed, pleasant and cooperative rrr No wheezing No LE edema  Data Reviewed:   I have personally reviewed following labs and imaging studies:  Labs: Labs show the following:   Basic Metabolic Panel: Recent Labs  Lab 08/15/19 2129 08/16/19  1230 08/17/19 0409 08/18/19 0502  NA 129* 131* 133* 136  K 3.5 3.4* 5.7* 4.7  CL 96* 100 101 105  CO2 22 24 21* 22  GLUCOSE 211* 190* 157* 139*  BUN 24* 23 22 14   CREATININE 0.92 0.90 0.80 0.85  CALCIUM 8.6* 7.8* 8.7* 8.1*  MG  --  1.8  --   --    GFR Estimated Creatinine Clearance: 49.1 mL/min (by C-G formula based on SCr of 0.85 mg/dL). Liver Function Tests: Recent Labs  Lab 08/15/19 2129 08/16/19 1230 08/18/19 0502  AST 37 34 56*  ALT 21 21 31   ALKPHOS 46 38 39  BILITOT 3.0* 2.0* 0.9  PROT 10.9* 9.7* 8.5*  ALBUMIN 2.3* 2.0* 2.0*   Recent  Labs  Lab 08/15/19 2129  LIPASE 14   No results for input(s): AMMONIA in the last 168 hours. Coagulation profile No results for input(s): INR, PROTIME in the last 168 hours.  CBC: Recent Labs  Lab 08/15/19 2129 08/16/19 1230 08/16/19 1741 08/17/19 0409 08/18/19 0502  WBC 5.1 4.8  --  6.3 5.6  HGB 8.5* 7.6* 7.8* 8.0* 7.1*  HCT 22.8* 23.7*  23.2* 21.3* 23.6* 21.1*  MCV 101.3* 95.6  --  97.9 97.2  PLT 163 156  --  157 177   Cardiac Enzymes: No results for input(s): CKTOTAL, CKMB, CKMBINDEX, TROPONINI in the last 168 hours. BNP (last 3 results) No results for input(s): PROBNP in the last 8760 hours. CBG: Recent Labs  Lab 08/17/19 1138 08/17/19 1629 08/17/19 2042 08/18/19 0602 08/18/19 1102  GLUCAP 193* 71 133* 134* 188*   D-Dimer: No results for input(s): DDIMER in the last 72 hours. Hgb A1c: Recent Labs    08/17/19 0409  HGBA1C 7.7*   Lipid Profile: No results for input(s): CHOL, HDL, LDLCALC, TRIG, CHOLHDL, LDLDIRECT in the last 72 hours. Thyroid function studies: No results for input(s): TSH, T4TOTAL, T3FREE, THYROIDAB in the last 72 hours.  Invalid input(s): FREET3 Anemia work up: Recent Labs    08/16/19 1230 08/18/19 0838  VITAMINB12 430  --   FERRITIN  --  360*  TIBC  --  181*  IRON  --  45   Sepsis Labs: Recent Labs  Lab 08/15/19 2129 08/16/19 1230 08/17/19 0409 08/18/19 0502  WBC 5.1 4.8 6.3 5.6    Microbiology Recent Results (from the past 240 hour(s))  Urine culture     Status: Abnormal   Collection Time: 08/16/19  9:19 AM   Specimen: Urine, Random  Result Value Ref Range Status   Specimen Description URINE, RANDOM  Final   Special Requests   Final    NONE Performed at [facility], [address], [place], [address] [place]    Culture MULTIPLE SPECIES PRESENT, SUGGEST RECOLLECTION (A)  Final   Report Status 08/17/2019 FINAL  Final  SARS CORONAVIRUS 2 (TAT 6-24 HRS) Nasopharyngeal Nasopharyngeal Swab     Status: None    Collection Time: 08/16/19 10:46 AM   Specimen: Nasopharyngeal Swab  Result Value Ref Range Status   SARS Coronavirus 2 NEGATIVE NEGATIVE Final    Comment: (NOTE) SARS-CoV-2 target nucleic acids are NOT DETECTED. The SARS-CoV-2 RNA is generally detectable in upper and lower respiratory specimens during the acute phase of infection. Negative results do not preclude SARS-CoV-2 infection, do not rule out co-infections with other pathogens, and should not be used as the sole basis for treatment or other patient management decisions. Negative results must be combined with clinical observations, patient  history, and epidemiological information. The expected result is Negative. Fact Sheet for Patients: HairSlick.nohttps://www.fda.gov/media/138098/download Fact Sheet for Healthcare Providers: quierodirigir.comhttps://www.fda.gov/media/138095/download This test is not yet approved or cleared by the [place] FDA and  has been authorized for detection and/or diagnosis of SARS-CoV-2 by FDA under an Emergency Use Authorization (EUA). This EUA will remain  in effect (meaning this test can be used) for the duration of the COVID-19 declaration under Section 56 4(b)(1) of the Act, 21 U.S.C. section 360bbb-3(b)(1), unless the authorization is terminated or revoked sooner. Performed at [facility], [address] [place], [place] [address]     Procedures and diagnostic studies:  No results found.  Medications:   . atorvastatin  40 mg Oral QHS  . DULoxetine  60 mg Oral Daily  . insulin aspart  0-9 Units Subcutaneous TID WC  . sodium chloride flush  3 mL Intravenous Q12H  . valACYclovir  500 mg Oral q morning - 10a   Continuous Infusions: . ciprofloxacin 400 mg (08/18/19 1001)  . metronidazole 500 mg (08/18/19 0446)     LOS: 1 day   Joseph ArtJessica U Loyd [place]  [facility]   How to contact the [facility] Attending or Consulting provider 7A - 7P or covering provider during after hours 7P -7A, for this patient?   1. Check the care team in [facility] and look for a) attending/consulting [facility] provider listed and b) the [facility] team listed 2. Log into [email or web address] and use [place]'s universal password to access. If you do not have the password, please contact the hospital operator. 3. Locate the [facility] provider you are looking for under [facility] and page to a number that you can be directly reached. 4. If you still have difficulty reaching the provider, please page the [facility] (Director on Call) for the Hospitalists listed on amion for assistance.  08/18/2019, 11:43 AM

## 2019-08-19 DIAGNOSIS — R1084 Generalized abdominal pain: Secondary | ICD-10-CM

## 2019-08-19 LAB — TYPE AND SCREEN
ABO/RH(D): O POS
Antibody Screen: NEGATIVE
Unit division: 0

## 2019-08-19 LAB — CBC
HCT: 26.4 % — ABNORMAL LOW (ref 36.0–46.0)
Hemoglobin: 8.7 g/dL — ABNORMAL LOW (ref 12.0–15.0)
MCH: 31.3 pg (ref 26.0–34.0)
MCHC: 33 g/dL (ref 30.0–36.0)
MCV: 95 fL (ref 80.0–100.0)
Platelets: 219 K/uL (ref 150–400)
RBC: 2.78 MIL/uL — ABNORMAL LOW (ref 3.87–5.11)
RDW: 16.5 % — ABNORMAL HIGH (ref 11.5–15.5)
WBC: 6.5 K/uL (ref 4.0–10.5)
nRBC: 0.5 % — ABNORMAL HIGH (ref 0.0–0.2)

## 2019-08-19 LAB — BPAM RBC
Blood Product Expiration Date: 202012012359
ISSUE DATE / TIME: 202011251000
Unit Type and Rh: 5100

## 2019-08-19 LAB — GLUCOSE, CAPILLARY: Glucose-Capillary: 129 mg/dL — ABNORMAL HIGH (ref 70–99)

## 2019-08-19 MED ORDER — AMOXICILLIN-POT CLAVULANATE 875-125 MG PO TABS: 1.0000 | ORAL_TABLET | Freq: Two times a day (BID) | ORAL | 0 refills | Status: AC

## 2019-08-19 NOTE — Discharge Instructions (Signed)
Colitis  Colitis is inflammation of the colon. Colitis may last a short time (be acute), or it may last a long time (become chronic). What are the causes? This condition may be caused by:  Viruses.  Bacteria.  Reaction to medicine.  Certain autoimmune diseases such as Crohn's disease or ulcerative colitis.  Radiation treatment.  Decreased blood flow to the bowel (ischemia). What are the signs or symptoms? Symptoms of this condition include:  Watery diarrhea.  Passing bloody or tarry stool.  Pain.  Fever.  Vomiting.  Tiredness (fatigue).  Weight loss.  Bloating.  Abdominal pain.  Having fewer bowel movements than usual.  A strong and sudden urge to have a bowel movement.  Feeling like the bowel is not empty after a bowel movement. How is this diagnosed? This condition is diagnosed with a stool test or a blood test. You may also have other tests, such as:  X-rays.  CT scan.  Colonoscopy.  Endoscopy.  Biopsy. How is this treated? Treatment for this condition depends on the cause. The condition may be treated by:  Resting the bowel. This involves not eating or drinking for a period of time.  Fluids that are given through an IV.  Medicine for pain and diarrhea.  Antibiotic medicines.  Cortisone medicines.  Surgery. Follow these instructions at home: Eating and drinking   Follow instructions from your health care provider about eating or drinking restrictions.  Drink enough fluid to keep your urine pale yellow.  Work with a dietitian to determine which foods cause your condition to flare up.  Avoid foods that cause flare-ups.  Eat a well-balanced diet. General instructions  If you were prescribed an antibiotic medicine, take it as told by your health care provider. Do not stop taking the antibiotic even if you start to feel better.  Take over-the-counter and prescription medicines only as told by your health care provider.  Keep all  follow-up visits as told by your health care provider. This is important. Contact a health care provider if:  Your symptoms do not go away.  You develop new symptoms. Get help right away if you:  Have a fever that does not go away with treatment.  Develop chills.  Have extreme weakness, fainting, or dehydration.  Have repeated vomiting.  Develop severe pain in your abdomen.  Pass bloody or tarry stool. Summary  Colitis is inflammation of the colon. Colitis may last a short time (be acute), or it may last a long time (become chronic).  Treatment for this condition depends on the cause and may include resting the bowel, taking medicines, or having surgery.  If you were prescribed an antibiotic medicine, take it as told by your health care provider. Do not stop taking the antibiotic even if you start to feel better.  Get help right away if you develop severe pain in your abdomen.  Keep all follow-up visits as told by your health care provider. This is important. This information is not intended to replace advice given to you by your health care provider. Make sure you discuss any questions you have with your health care provider. Document Released: 10/17/2004 Document Revised: 03/12/2018 Document Reviewed: 03/12/2018 Elsevier Patient Education  2020 [employer]  

## 2019-08-19 NOTE — TOC Transition Note (Signed)
Transition of Care [facility]) - CM/SW Discharge Note   Patient Details  Name: Traci Ortega MRN: [id number] Date of Birth: 1941-07-03  Transition of Care [facility]) CM/SW Contact:  Bartholomew Crews, RN Phone Number: [phone] 08/19/2019, 9:58 AM   Clinical Narrative:    Patient to transition home today. No TOC needs identified.    Final next level of care: Home/Self Care Barriers to Discharge: No Barriers Identified   Patient Goals and CMS Choice Patient states their goals for this hospitalization and ongoing recovery are:: to return to home CMS Medicare.gov Compare Post Acute Care list provided to:: Patient Choice offered to / list presented to : NA  Discharge Placement                       Discharge Plan and Services     Post Acute Care Choice: NA          DME Arranged: N/A DME Agency: NA       HH Arranged: NA HH Agency: NA        Social Determinants of Health (SDOH) Interventions     Readmission Risk Interventions No flowsheet data found.

## 2019-08-19 NOTE — Discharge Summary (Signed)
Physician Discharge Summary  Traci Ortega ZOX:[id number] DOB: 1940-12-27 DOA: 08/15/2019  PCP: Patient, No Pcp Per  Admit date: 08/15/2019 Discharge date: 08/19/2019  Admitted From: Home Disposition:  Home  Recommendations for Outpatient Follow-up:  1. Follow up with PCP and GI in 1 week back in [place] 2. Incidental finding of 2 cm left adrenal nodule, 1 year follow-up adrenal protocol CT abdomen without and with contrast is recommended  Discharge Condition: Stable CODE STATUS: Full  Diet recommendation: Carb modified   Brief/Interim Summary: Traci ShengBeverly Ortega is an 78 y.o. female with medical history significant ofhyperlipidemia and diabetes mellitus type 2. She presents with complaints of abdominal pain, nausea, vomiting, and diarrhea over the last couple weeks. Patient is a poor historian. She came into town for a wedding from [place]. Her abdominal pain has moved from right to the left and then she reports that is all over. Emesis is noted to be nonbloody and nonbilious. She complains of having diarrhea, but does not know if she has had any blood present. She has not been able to eat much of anything due to to her symptoms.  CT abdomen pelvis revealed colitis.  Patient was treated with supportive care, IV fluids, IV antibiotics.  She continued to improve symptomatically.  On day of discharge, she was tolerating a regular diet without worsening nausea, vomiting, diarrhea or abdominal pain.  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 -Patient  receives vitamin B12 injections as an outpatient -Patient received 1 unit packed red blood cells during hospitalization -FOBT negative -Hemoglobin stable  Hyponatremia -Resolved  Hyperbilirubinemia -Resolved  Type 2 diabetes, with hyperglycemia -Hemoglobin A1c 7.7 -Resume home meds  Hyperlipidemia -Continue Lipitor    Discharge Instructions  Discharge Instructions    Call MD for:  difficulty breathing, headache or visual disturbances   Complete by: As directed    Call MD for:  extreme fatigue   Complete by: As directed    Call MD for:  persistant dizziness or light-headedness   Complete by: As directed    Call MD for:  persistant nausea and vomiting   Complete by: As directed    Call MD for:  severe uncontrolled pain   Complete by: As directed    Call MD for:  temperature >100.4   Complete by: As directed    Diet Carb Modified   Complete by: As directed    Discharge instructions   Complete by: As directed    You were cared for by a hospitalist during your hospital stay. If you have any questions about your discharge medications or the care you received while you were in the hospital after you are discharged, you can call the unit and ask to speak with the hospitalist on call if the hospitalist that took care of you is not available. Once you are discharged, your primary care physician will handle any further medical issues. Please note that NO REFILLS for any discharge medications will be authorized once you are discharged, as it is imperative that you return to your primary care physician (or establish a relationship with a primary care physician if  you do not have one) for your aftercare needs so that they can reassess your need for medications and monitor your lab values.   Increase activity slowly   Complete by: As directed      Allergies as of 08/19/2019   No Known Allergies     Medication List    TAKE these medications   acetaminophen 500 MG tablet Commonly  known as: TYLENOL Take 1,000 mg by mouth daily as needed (pain).   amoxicillin-clavulanate 875-125 MG tablet Commonly known as: AUGMENTIN Take 1 tablet by mouth every 12 (twelve) hours for 5 days.   atorvastatin 40 MG tablet Commonly known as: LIPITOR Take 40 mg by mouth at bedtime.   cyanocobalamin 1000 MCG/ML injection Commonly known as: (VITAMIN B-12) Inject 1,000 mcg into the muscle every 30 (thirty) days.   DULoxetine 60 MG capsule Commonly known as: CYMBALTA Take 60 mg by mouth daily.   fluticasone 50 MCG/ACT nasal spray Commonly known as: FLONASE Place 1 spray into both nostrils daily as needed for allergies or rhinitis.   lidocaine 5 % Commonly known as: LIDODERM Place 1 patch onto the skin daily as needed (pain). Remove & Discard patch within 12 hours or as directed by MD   metFORMIN 500 MG tablet Commonly known as: GLUCOPHAGE Take 500 mg by mouth 2 (two) times daily.   THERATEARS OP Place 1 drop into both eyes daily as needed (dry eyes).   valACYclovir 500 MG tablet Commonly known as: VALTREX Take 500 mg by mouth every morning.   Vitamin D (Ergocalciferol) 1.25 MG (50000 UT) Caps capsule Commonly known as: DRISDOL Take 50,000 Units by mouth every Wednesday.      Follow-up Information    Follow up with your PCP and GI in [place] when you return home Follow up.          No Known Allergies  Consultations:  None   Procedures/Studies: Ct Abdomen Pelvis W Contrast  Addendum Date: 08/16/2019   ADDENDUM REPORT: 08/16/2019 06:29 ADDENDUM: There should also have been an impression # 5 which reads: 5.  Two cm indeterminate left adrenal nodule. In the absence of prior imaging and with no history of cancer - a 1 year follow-up Adrenal Protocol CT Abdomen without and with contrast is recommended. This recommendation follows ACR consensus guidelines: Management of Incidental Adrenal Masses: A White Paper of the ACR Incidental Findings Committee. J Am Coll  Radiol 2017;14:1038-1044. Electronically Signed   By: Genevie Ann M.D.   On: 08/16/2019 06:29   Result Date: 08/16/2019 CLINICAL DATA:  78 year old female with abdominal pain vomiting and diarrhea. EXAM: CT ABDOMEN AND PELVIS WITH CONTRAST TECHNIQUE: Multidetector CT imaging of the abdomen and pelvis was performed using the standard protocol following bolus administration of intravenous contrast. CONTRAST:  136mL OMNIPAQUE IOHEXOL 300 MG/ML  SOLN COMPARISON:  None. FINDINGS: Lower chest: Calcified granuloma in the lingula, negative visible lung bases. No pericardial or pleural effusion. Possible circumferential soft tissue thickening of the distal esophagus on series 5, image 22. Small gastric hiatal hernia superimposed. Hepatobiliary: Negative liver and gallbladder. No bile duct enlargement. Pancreas: Negative aside from a degree of atrophy. Spleen: Negative. Adrenals/Urinary Tract: Intermediate density 2 centimeter rounded nodule of the left lateral adrenal limb on series 3, image 25. The right adrenal gland is normal. There is mild left pararenal space asymmetric stranding which might be related to the left colon. However, there is also subtle asymmetric enhancement of the left kidney, especially the lateral left upper pole and  left mid pole. This subtle asymmetric enhancement persists on delayed images. However, there is symmetric contrast excretion. No discrete left renal mass. Proximal ureters appear symmetric and normal. Negative course of the left ureter with superimposed phleboliths. Stomach/Bowel: Negative rectum. Severe diverticulosis of the sigmoid colon, but no active inflammation identified. Diverticula continue into the distal descending colon. The left colon is decompressed, and featureless through to the distal transverse colon. There is questionable mesenteric stranding in and around the splenic flexure (series 3, image 29 and coronal image 48). The mid and proximal transverse colon than have a  normal appearance. Moderate right colon diverticulosis with no active inflammation. Decompressed terminal ileum. Normal appendix suspected on series 3, image 53. No dilated small bowel. Negative intraabdominal stomach. Negative duodenum. No free air. No free fluid. No convincing small bowel inflammation. Vascular/Lymphatic: Aortoiliac calcified atherosclerosis. Major arterial structures are patent. Portal venous system is patent. No lymphadenopathy. Reproductive: Negative. Other: No pelvic free fluid. Musculoskeletal: Osteopenia. Lower lumbar facet degeneration. No acute osseous abnormality identified. IMPRESSION: 1. Left pararenal space/left gutter inflammatory stranding with both subtle asymmetric left renal enhancement and a decompressed/featureless appearance of the left large bowel in that region. Differential considerations include Acute Left Pyelonephritis versus Left Acute Colitis. Recommend correlation with urinalysis. No obstructive uropathy, free air or free fluid. 2. Small gastric hiatal hernia with possible superimposed distal esophageal thickening. Consider esophagitis. 3. Superimposed large bowel diverticulosis, maximal in the sigmoid, with no associated active inflammation. 4.  Aortic Atherosclerosis (ICD10-I70.0). Electronically Signed: By: Odessa Fleming M.D. On: 08/16/2019 05:47       Discharge Exam: Vitals:   08/19/19 0513 08/19/19 0813  BP: (!) 114/53 138/60  Pulse: 76 79  Resp: 18 18  Temp: 97.9 F (36.6 C) 97.6 F (36.4 C)  SpO2: 94% 97%     General: Pt is alert, awake, not in acute distress Cardiovascular: RRR, S1/S2 +, no edema Respiratory: CTA bilaterally, no wheezing, no rhonchi, no respiratory distress, no conversational dyspnea  Abdominal: Soft, NT, ND, bowel sounds + Extremities: no edema, no cyanosis Psych: Normal mood and affect, stable judgement and insight     The results of significant diagnostics from this hospitalization (including imaging, microbiology,  ancillary and laboratory) are listed below for reference.     Microbiology: Recent Results (from the past 240 hour(s))  Urine culture     Status: Abnormal   Collection Time: 08/16/19  9:19 AM   Specimen: Urine, Random  Result Value Ref Range Status   Specimen Description URINE, RANDOM  Final   Special Requests   Final    NONE Performed at [facility], [address], [place], [place] [address]    Culture MULTIPLE SPECIES PRESENT, SUGGEST RECOLLECTION (A)  Final   Report Status 08/17/2019 FINAL  Final  SARS CORONAVIRUS 2 (TAT 6-24 HRS) Nasopharyngeal Nasopharyngeal Swab     Status: None   Collection Time: 08/16/19 10:46 AM   Specimen: Nasopharyngeal Swab  Result Value Ref Range Status   SARS Coronavirus 2 NEGATIVE NEGATIVE Final    Comment: (NOTE) SARS-CoV-2 target nucleic acids are NOT DETECTED. The SARS-CoV-2 RNA is generally detectable in upper and lower respiratory specimens during the acute phase of infection. Negative results do not preclude SARS-CoV-2 infection, do not rule out co-infections with other pathogens, and should not be used as the sole basis for treatment or other patient management decisions. Negative results must be combined with clinical observations, patient history, and epidemiological information. The expected result is Negative. Fact  Sheet for Patients: HairSlick.no Fact Sheet for Healthcare Providers: quierodirigir.com This test is not yet approved or cleared by the [place] FDA and  has been authorized for detection and/or diagnosis of SARS-CoV-2 by FDA under an Emergency Use Authorization (EUA). This EUA will remain  in effect (meaning this test can be used) for the duration of the COVID-19 declaration under Section 56 4(b)(1) of the Act, 21 U.S.C. section 360bbb-3(b)(1), unless the authorization is terminated or revoked sooner. Performed at [facility], [address],  [place], [place] [address]      Labs: BNP (last 3 results) No results for input(s): BNP in the last 8760 hours. Basic Metabolic Panel: Recent Labs  Lab 08/15/19 2129 08/16/19 1230 08/17/19 0409 08/18/19 0502  NA 129* 131* 133* 136  K 3.5 3.4* 5.7* 4.7  CL 96* 100 101 105  CO2 22 24 21* 22  GLUCOSE 211* 190* 157* 139*  BUN 24* CREATININE 0.92 0.90 0.80 0.85  CALCIUM 8.6* 7.8* 8.7* 8.1*  MG  --  1.8  --   --    Liver Function Tests: Recent Labs  Lab 08/15/19 2129 08/16/19 1230 08/18/19 0502  AST 37 34 56*  ALT ALKPHOS 46 38 39  BILITOT 3.0* 2.0* 0.9  PROT 10.9* 9.7* 8.5*  ALBUMIN 2.3* 2.0* 2.0*   Recent Labs  Lab 08/15/19 2129  LIPASE 14   No results for input(s): AMMONIA in the last 168 hours. CBC: Recent Labs  Lab 08/15/19 2129 08/16/19 1230 08/16/19 1741 08/17/19 0409 08/18/19 0502 08/18/19 1451 08/19/19 0020  WBC 5.1 4.8  --  6.3 5.6  --  6.5  HGB 8.5* 7.6* 7.8* 8.0* 7.1* 8.3* 8.7*  HCT 22.8* 23.7*  23.2* 21.3* 23.6* 21.1* 24.1* 26.4*  MCV 101.3* 95.6  --  97.9 97.2  --  95.0  PLT 163 156  --  157 177  --  219   Cardiac Enzymes: No results for input(s): CKTOTAL, CKMB, CKMBINDEX, TROPONINI in the last 168 hours. BNP: Invalid input(s): POCBNP CBG: Recent Labs  Lab 08/18/19 0602 08/18/19 1102 08/18/19 1617 08/18/19 2102 08/19/19 0719  GLUCAP 134* 188* 131* 201* 129*   D-Dimer No results for input(s): DDIMER in the last 72 hours. Hgb A1c Recent Labs    08/17/19 0409  HGBA1C 7.7*   Lipid Profile No results for input(s): CHOL, HDL, LDLCALC, TRIG, CHOLHDL, LDLDIRECT in the last 72 hours. Thyroid function studies No results for input(s): TSH, T4TOTAL, T3FREE, THYROIDAB in the last 72 hours.  Invalid input(s): FREET3 Anemia work up Recent Labs    08/16/19 1230 08/18/19 0838  VITAMINB12 430  --   FERRITIN  --  360*  TIBC  --  181*  IRON  --  45   Urinalysis    Component Value Date/Time   COLORURINE YELLOW  08/16/2019 0919   APPEARANCEUR HAZY (A) 08/16/2019 0919   LABSPEC 1.031 (H) 08/16/2019 0919   PHURINE 5.0 08/16/2019 0919   GLUCOSEU NEGATIVE 08/16/2019 0919   HGBUR MODERATE (A) 08/16/2019 0919   BILIRUBINUR NEGATIVE 08/16/2019 0919   KETONESUR NEGATIVE 08/16/2019 0919   PROTEINUR NEGATIVE 08/16/2019 0919   NITRITE NEGATIVE 08/16/2019 0919   LEUKOCYTESUR SMALL (A) 08/16/2019 0919   Sepsis Labs Invalid input(s): PROCALCITONIN,  WBC,  LACTICIDVEN Microbiology Recent Results (from the past 240 hour(s))  Urine culture     Status: Abnormal   Collection Time: 08/16/19  9:19 AM   Specimen: Urine, Random  Result Value Ref Range Status   Specimen Description URINE, RANDOM  Final   Special Requests   Final    NONE Performed at [facility], [address], [place], [place] [address]    Culture MULTIPLE SPECIES PRESENT, SUGGEST RECOLLECTION (A)  Final   Report Status 08/17/2019 FINAL  Final  SARS CORONAVIRUS 2 (TAT 6-24 HRS) Nasopharyngeal Nasopharyngeal Swab     Status: None   Collection Time: 08/16/19 10:46 AM   Specimen: Nasopharyngeal Swab  Result Value Ref Range Status   SARS Coronavirus 2 NEGATIVE NEGATIVE Final    Comment: (NOTE) SARS-CoV-2 target nucleic acids are NOT DETECTED. The SARS-CoV-2 RNA is generally detectable in upper and lower respiratory specimens during the acute phase of infection. Negative results do not preclude SARS-CoV-2 infection, do not rule out co-infections with other pathogens, and should not be used as the sole basis for treatment or other patient management decisions. Negative results must be combined with clinical observations, patient history, and epidemiological information. The expected result is Negative. Fact Sheet for Patients: HairSlick.no Fact Sheet for Healthcare Providers: quierodirigir.com This test is not yet approved or cleared by the [place] FDA and  has been  authorized for detection and/or diagnosis of SARS-CoV-2 by FDA under an Emergency Use Authorization (EUA). This EUA will remain  in effect (meaning this test can be used) for the duration of the COVID-19 declaration under Section 56 4(b)(1) of the Act, 21 U.S.C. section 360bbb-3(b)(1), unless the authorization is terminated or revoked sooner. Performed at [facility], [address], [place], [place] [address]      Patient was seen and examined on the day of discharge and was found to be in stable condition. Time coordinating discharge: 40 minutes including assessment and coordination of care, as well as examination of the patient.   SIGNED:  Noralee Stain, DO [facility] 08/19/2019, 11:21 AM

## 2020-12-08 IMAGING — CT CT ABD-PELV W/ CM
2 of 5 series · 12 of 46 positions shown, 14 images · IV contrast (APPLIED)
Comparison: None.
COMPARISON: None.

Addendum:
CLINICAL DATA: 78-year-old female with abdominal pain vomiting and
diarrhea.

EXAM:
CT ABDOMEN AND PELVIS WITH CONTRAST
TECHNIQUE: Multidetector CT imaging of the abdomen and pelvis was performed
using the standard protocol following bolus administration of
intravenous contrast.
CONTRAST:  100mL OMNIPAQUE IOHEXOL 300 MG/ML  SOLN

[Series 3: abdomen 5.0 · axial · 0.83mm/px · z∈[-493,-148]mm · 9 of 85 slices shown, 11 images]
[im 8/85  soft-tissue]
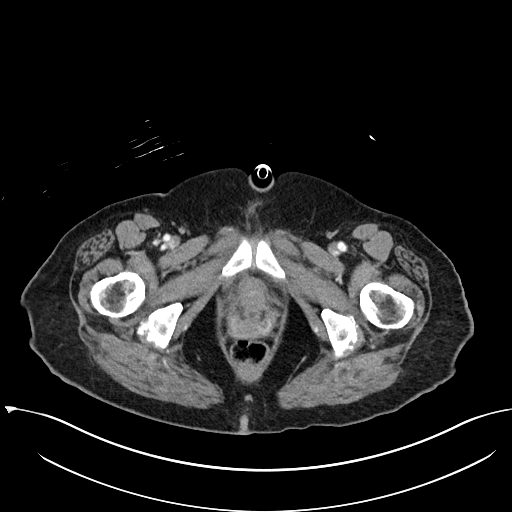
[im 8/85  bone]
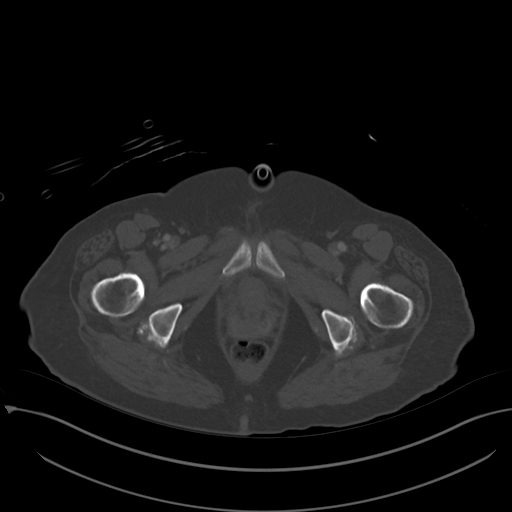
[im 16/85  soft-tissue]
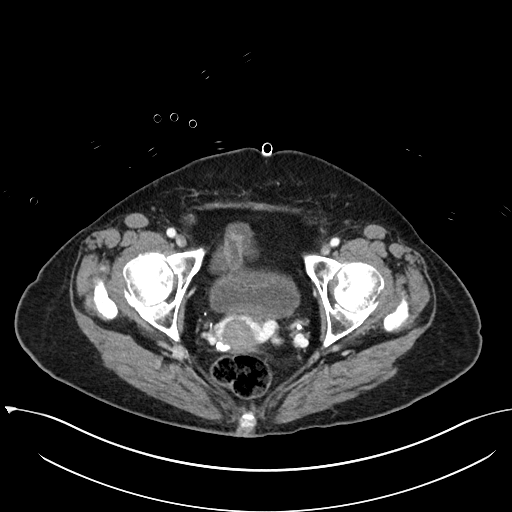
[im 23/85  soft-tissue]
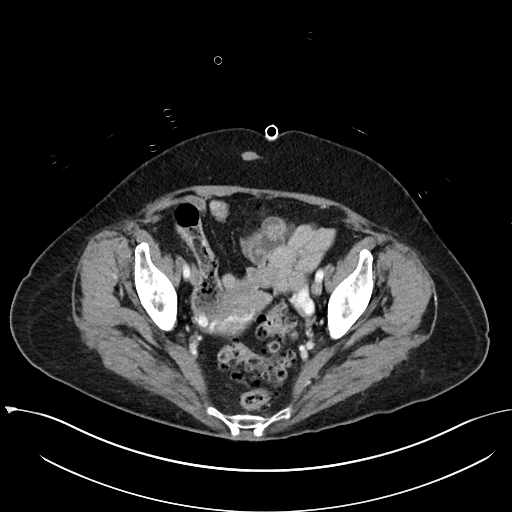
[im 31/85  soft-tissue]
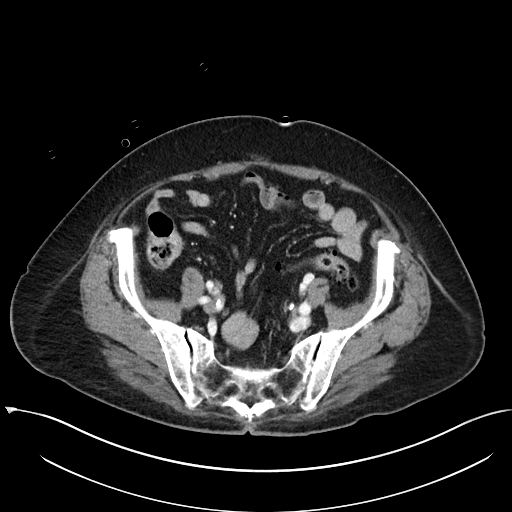
[im 46/85  soft-tissue]
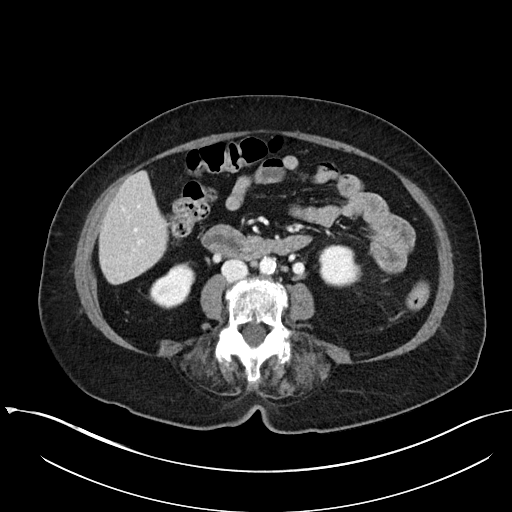
[im 54/85  soft-tissue]
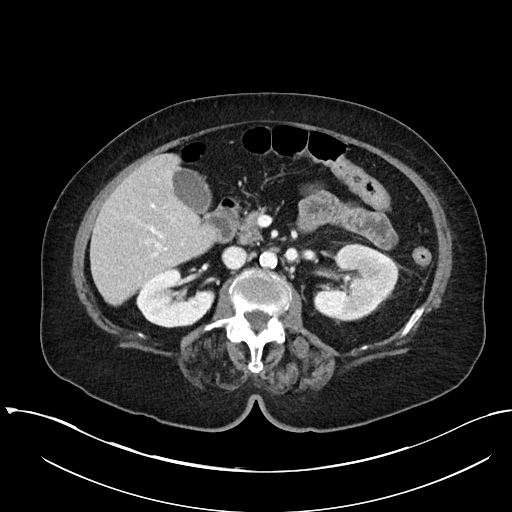
[im 62/85  soft-tissue]
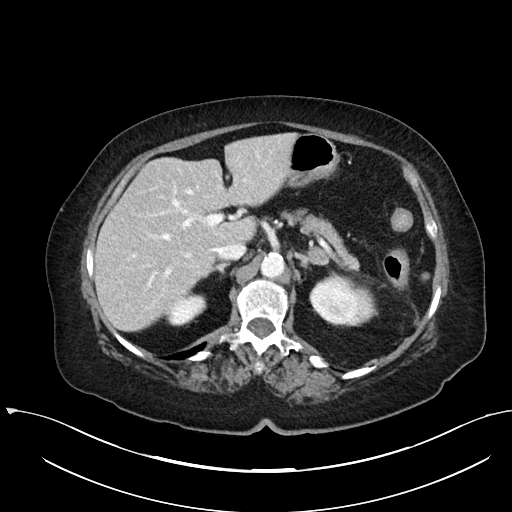
[im 69/85  soft-tissue]
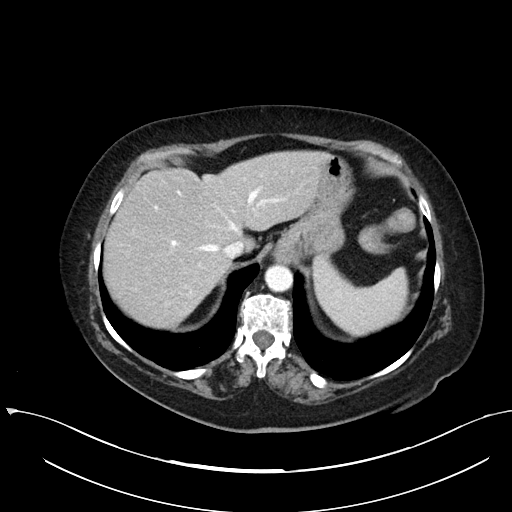
[im 77/85  soft-tissue]
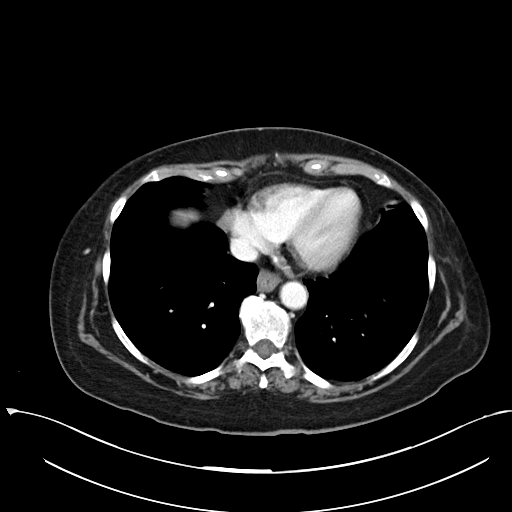
[im 77/85  bone]
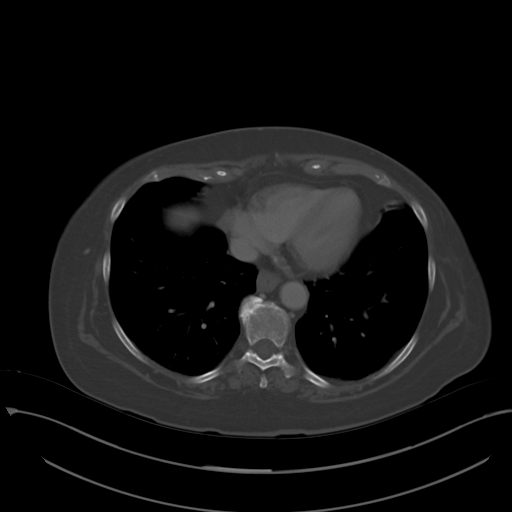

[Series 6: abdomen 3.0 mpr cor · coronal · 0.72mm/px · 3 of 98 slices shown]
[im 33/98  soft-tissue]
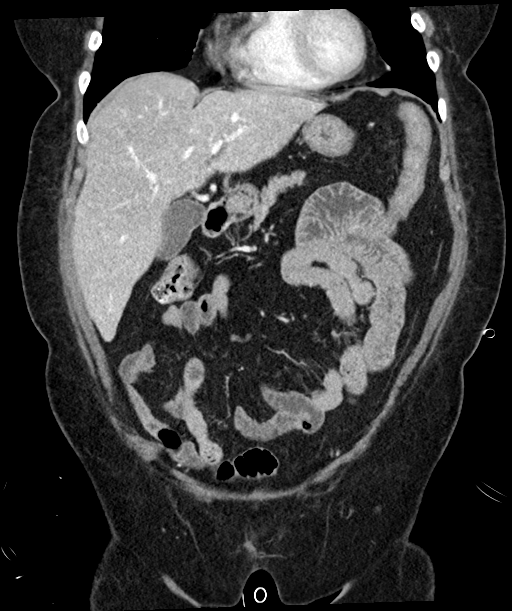
[im 44/98  soft-tissue]
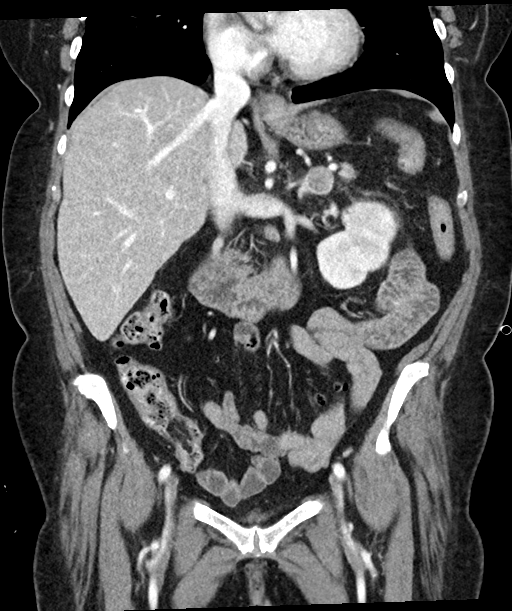
[im 54/98  soft-tissue]
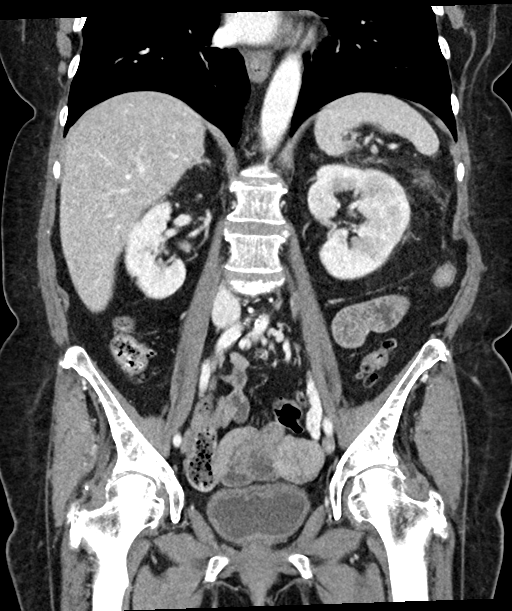

[12 of 46 positions shown; findings below may reference images not displayed]

FINDINGS: Lower chest: Calcified granuloma in the lingula, negative visible
lung bases. No pericardial or pleural effusion.

Possible circumferential soft tissue thickening of the distal
esophagus on series 5, image 22. Small gastric hiatal hernia
superimposed.

Hepatobiliary: Negative liver and gallbladder. No bile duct
enlargement.

Pancreas: Negative aside from a degree of atrophy.

Spleen: Negative.

Adrenals/Urinary Tract: Intermediate density 2 centimeter rounded
nodule of the left lateral adrenal limb on series 3, image 25. The
right adrenal gland is normal.

There is mild left pararenal space asymmetric stranding which might
be related to the left colon. However, there is also subtle
asymmetric enhancement of the left kidney, especially the lateral
left upper pole and left mid pole. This subtle asymmetric
enhancement persists on delayed images. However, there is symmetric
contrast excretion. No discrete left renal mass. Proximal ureters
appear symmetric and normal. Negative course of the left ureter with
superimposed phleboliths.

Stomach/Bowel:

Negative rectum.

Severe diverticulosis of the sigmoid colon, but no active
inflammation identified. Diverticula continue into the distal
descending colon. The left colon is decompressed, and featureless
through to the distal transverse colon. There is questionable
mesenteric stranding in and around the splenic flexure (series 3,
image 29 and coronal image 48).

The mid and proximal transverse colon than have a normal appearance.
Moderate right colon diverticulosis with no active inflammation.
Decompressed terminal ileum. Normal appendix suspected on series 3,
image 53.

No dilated small bowel. Negative intraabdominal stomach. Negative
duodenum. No free air. No free fluid. No convincing small bowel
inflammation.

Vascular/Lymphatic: Aortoiliac calcified atherosclerosis. Major
arterial structures are patent. Portal venous system is patent.

No lymphadenopathy.

Reproductive: Negative.

Other: No pelvic free fluid.

Musculoskeletal: Osteopenia. Lower lumbar facet degeneration. No
acute osseous abnormality identified.
IMPRESSION: 1. Left pararenal space/left gutter inflammatory stranding with both
subtle asymmetric left renal enhancement and a
decompressed/featureless appearance of the left large bowel in that
region.
Differential considerations include Acute Left Pyelonephritis versus
Left Acute Colitis.
Recommend correlation with urinalysis. No obstructive uropathy, free
air or free fluid.
2. Small gastric hiatal hernia with possible superimposed distal
esophageal thickening. Consider esophagitis.
3. Superimposed large bowel diverticulosis, maximal in the sigmoid,
with no associated active inflammation.
4.  Aortic Atherosclerosis ([id number]-[id number].K).

ADDENDUM:
There should also have been an impression # 5 which reads:

5.  Two cm indeterminate left adrenal nodule.
In the absence of prior imaging and with no history of cancer - a 1
year follow-up Adrenal Protocol CT Abdomen without and with contrast
is recommended.
This recommendation follows ACR consensus guidelines: Management of
Incidental Adrenal Masses: A White Paper of the ACR Incidental
Findings Committee. [HOSPITAL] 9698;14:3451-3499.

*** End of Addendum ***
FINDINGS: Lower chest: Calcified granuloma in the lingula, negative visible
lung bases. No pericardial or pleural effusion.

Possible circumferential soft tissue thickening of the distal
esophagus on series 5, image 22. Small gastric hiatal hernia
superimposed.

Hepatobiliary: Negative liver and gallbladder. No bile duct
enlargement.

Pancreas: Negative aside from a degree of atrophy.

Spleen: Negative.

Adrenals/Urinary Tract: Intermediate density 2 centimeter rounded
nodule of the left lateral adrenal limb on series 3, image 25. The
right adrenal gland is normal.

There is mild left pararenal space asymmetric stranding which might
be related to the left colon. However, there is also subtle
asymmetric enhancement of the left kidney, especially the lateral
left upper pole and left mid pole. This subtle asymmetric
enhancement persists on delayed images. However, there is symmetric
contrast excretion. No discrete left renal mass. Proximal ureters
appear symmetric and normal. Negative course of the left ureter with
superimposed phleboliths.

Stomach/Bowel:

Negative rectum.

Severe diverticulosis of the sigmoid colon, but no active
inflammation identified. Diverticula continue into the distal
descending colon. The left colon is decompressed, and featureless
through to the distal transverse colon. There is questionable
mesenteric stranding in and around the splenic flexure (series 3,
image 29 and coronal image 48).

The mid and proximal transverse colon than have a normal appearance.
Moderate right colon diverticulosis with no active inflammation.
Decompressed terminal ileum. Normal appendix suspected on series 3,
image 53.

No dilated small bowel. Negative intraabdominal stomach. Negative
duodenum. No free air. No free fluid. No convincing small bowel
inflammation.

Vascular/Lymphatic: Aortoiliac calcified atherosclerosis. Major
arterial structures are patent. Portal venous system is patent.

No lymphadenopathy.

Reproductive: Negative.

Other: No pelvic free fluid.

Musculoskeletal: Osteopenia. Lower lumbar facet degeneration. No
acute osseous abnormality identified.
IMPRESSION: 1. Left pararenal space/left gutter inflammatory stranding with both
subtle asymmetric left renal enhancement and a
decompressed/featureless appearance of the left large bowel in that
region.
Differential considerations include Acute Left Pyelonephritis versus
Left Acute Colitis.
Recommend correlation with urinalysis. No obstructive uropathy, free
air or free fluid.
2. Small gastric hiatal hernia with possible superimposed distal
esophageal thickening. Consider esophagitis.
3. Superimposed large bowel diverticulosis, maximal in the sigmoid,
with no associated active inflammation.
4.  Aortic Atherosclerosis (Z811O-[id number].K).

## 2023-03-24 DEATH — deceased
